# Patient Record
Sex: Female | Born: 1960 | Race: Black or African American | Hispanic: No | Marital: Married | Smoking: Never smoker
Health system: Southern US, Community
[De-identification: ages and names within clinical notes are randomized; demographics above are authoritative.]

## PROBLEM LIST (undated history)

## (undated) DIAGNOSIS — K635 Polyp of colon: Secondary | ICD-10-CM

## (undated) DIAGNOSIS — E119 Type 2 diabetes mellitus without complications: Secondary | ICD-10-CM

## (undated) DIAGNOSIS — D649 Anemia, unspecified: Secondary | ICD-10-CM

## (undated) DIAGNOSIS — E559 Vitamin D deficiency, unspecified: Secondary | ICD-10-CM

## (undated) DIAGNOSIS — I10 Essential (primary) hypertension: Secondary | ICD-10-CM

## (undated) DIAGNOSIS — K649 Unspecified hemorrhoids: Secondary | ICD-10-CM

## (undated) HISTORY — PX: BACK SURGERY: SHX140

## (undated) HISTORY — DX: Vitamin D deficiency, unspecified: E55.9

## (undated) HISTORY — DX: Unspecified hemorrhoids: K64.9

## (undated) HISTORY — DX: Anemia, unspecified: D64.9

## (undated) HISTORY — DX: Type 2 diabetes mellitus without complications: E11.9

## (undated) HISTORY — DX: Polyp of colon: K63.5

## (undated) HISTORY — DX: Essential (primary) hypertension: I10

---

## 1998-10-18 ENCOUNTER — Other Ambulatory Visit: Admission: RE | Admit: 1998-10-18 | Discharge: 1998-10-18 | Payer: Self-pay | Admitting: Obstetrics and Gynecology

## 2000-08-20 ENCOUNTER — Other Ambulatory Visit: Admission: RE | Admit: 2000-08-20 | Discharge: 2000-08-20 | Payer: Self-pay | Admitting: *Deleted

## 2001-06-08 ENCOUNTER — Other Ambulatory Visit: Admission: RE | Admit: 2001-06-08 | Discharge: 2001-06-08 | Payer: Self-pay | Admitting: *Deleted

## 2001-07-07 ENCOUNTER — Ambulatory Visit (HOSPITAL_COMMUNITY): Admission: RE | Admit: 2001-07-07 | Discharge: 2001-07-07 | Payer: Self-pay

## 2001-08-17 ENCOUNTER — Inpatient Hospital Stay (HOSPITAL_COMMUNITY): Admission: RE | Admit: 2001-08-17 | Discharge: 2001-08-19 | Payer: Self-pay

## 2001-08-17 ENCOUNTER — Encounter (INDEPENDENT_AMBULATORY_CARE_PROVIDER_SITE_OTHER): Payer: Self-pay

## 2002-08-04 ENCOUNTER — Encounter: Admission: RE | Admit: 2002-08-04 | Discharge: 2002-08-04 | Payer: Self-pay

## 2003-08-24 ENCOUNTER — Encounter: Admission: RE | Admit: 2003-08-24 | Discharge: 2003-08-24 | Payer: Self-pay | Admitting: *Deleted

## 2004-07-01 HISTORY — PX: EYE SURGERY: SHX253

## 2004-07-01 HISTORY — PX: ABDOMINAL HYSTERECTOMY: SHX81

## 2005-12-05 ENCOUNTER — Encounter: Admission: RE | Admit: 2005-12-05 | Discharge: 2005-12-05 | Payer: Self-pay | Admitting: *Deleted

## 2006-10-09 ENCOUNTER — Encounter: Admission: RE | Admit: 2006-10-09 | Discharge: 2006-11-04 | Payer: Self-pay | Admitting: Family Medicine

## 2007-01-14 ENCOUNTER — Encounter: Admission: RE | Admit: 2007-01-14 | Discharge: 2007-01-14 | Payer: Self-pay | Admitting: *Deleted

## 2008-03-23 ENCOUNTER — Encounter: Admission: RE | Admit: 2008-03-23 | Discharge: 2008-03-23 | Payer: Self-pay | Admitting: Family Medicine

## 2008-05-13 ENCOUNTER — Emergency Department (HOSPITAL_COMMUNITY): Admission: EM | Admit: 2008-05-13 | Discharge: 2008-05-13 | Payer: Self-pay | Admitting: Emergency Medicine

## 2008-08-31 ENCOUNTER — Encounter: Admission: RE | Admit: 2008-08-31 | Discharge: 2008-08-31 | Payer: Self-pay | Admitting: Family Medicine

## 2008-11-23 IMAGING — CT CT ABD/PEL WO
2 of 5 series · 12 of 42 positions shown, 19 images · non-contrast
Comparison: NONE

CLINICAL DATA: Attn. RAIBIKIS, SALDAINIS  Calcification, right side, 
on  plain film. 

CT OF THE ABDOMEN AND PELVIS FOLLOWING ORAL CONTRAST BUT WITHOUT 
INTRAVENOUS  CONTRAST
TECHNIQUE: Multiple axial images were obtained from the lung 
base through the pelvis following oral contrast.

[Series 2: wo · axial · 0.73mm/px · z∈[+694,+1019]mm · 9 of 83 slices shown, 15 images]
[im 9/83  soft-tissue]
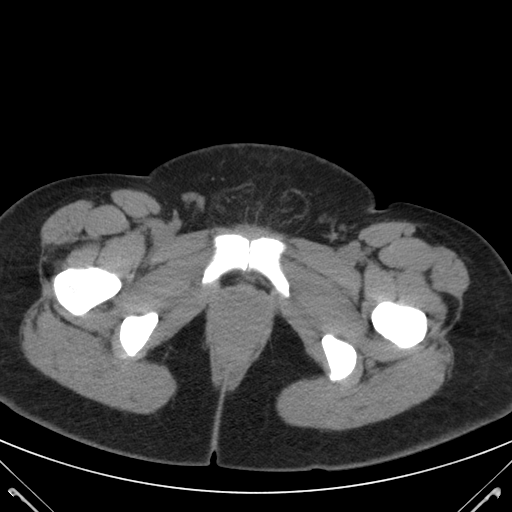
[im 9/83  bone]
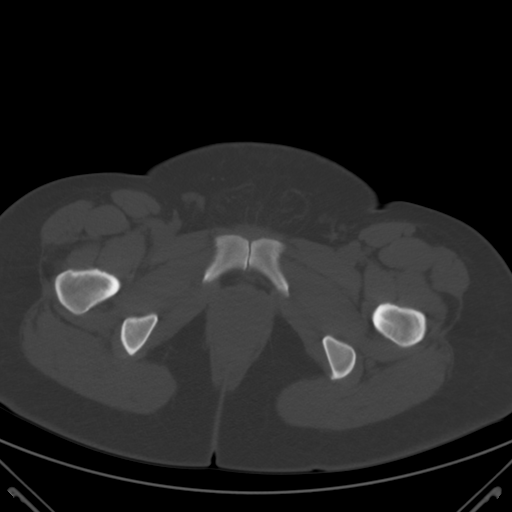
[im 17/83  soft-tissue]
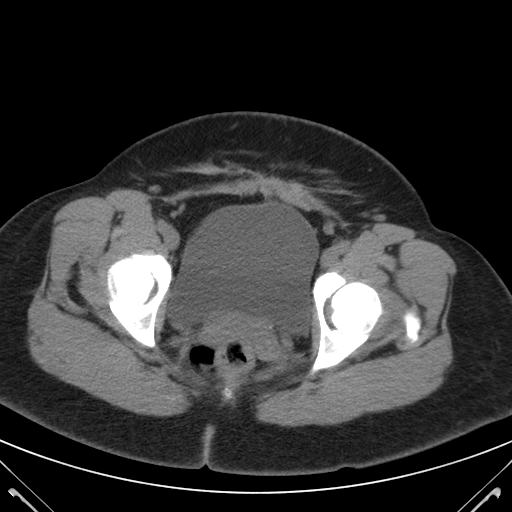
[im 25/83  soft-tissue]
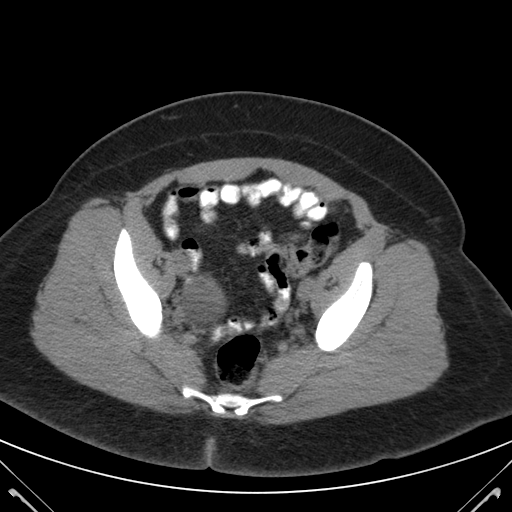
[im 33/83  soft-tissue]
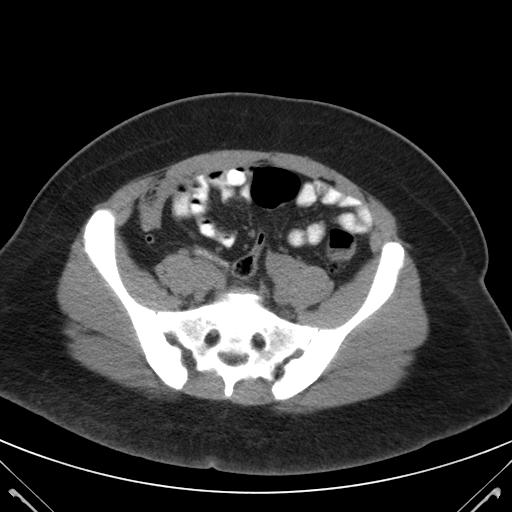
[im 42/83  soft-tissue]
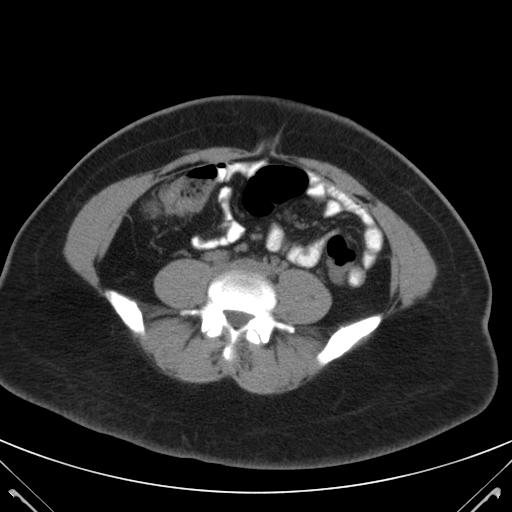
[im 50/83  soft-tissue]
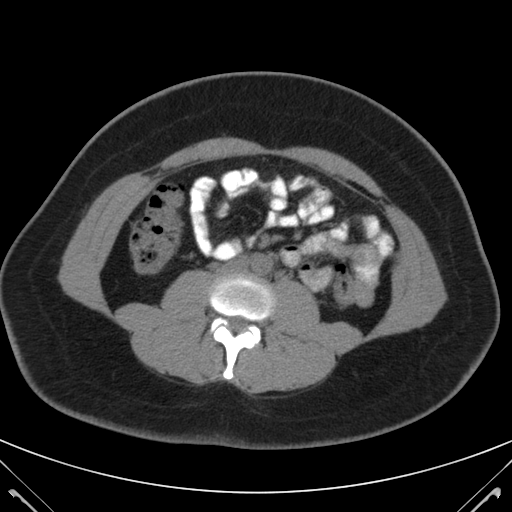
[im 50/83  lung]
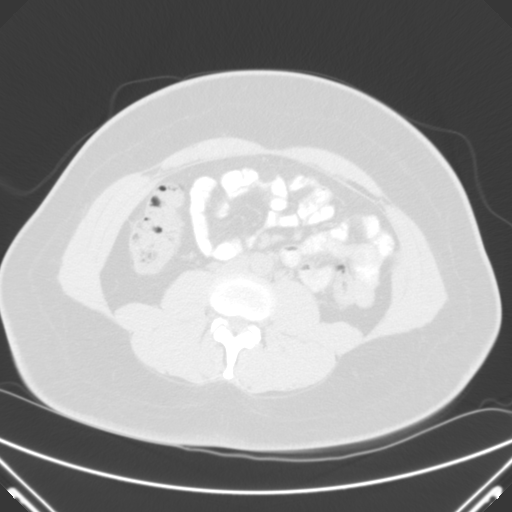
[im 58/83  soft-tissue]
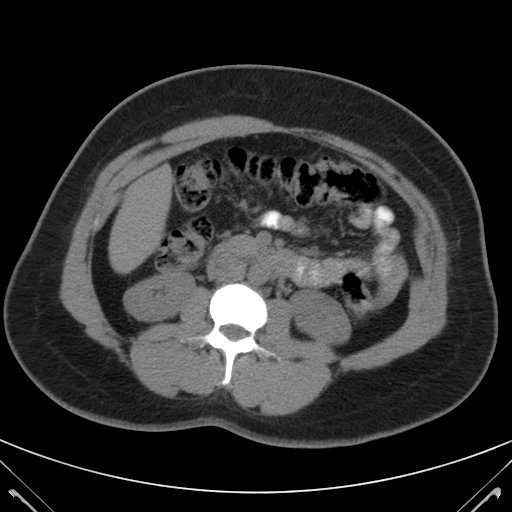
[im 58/83  lung]
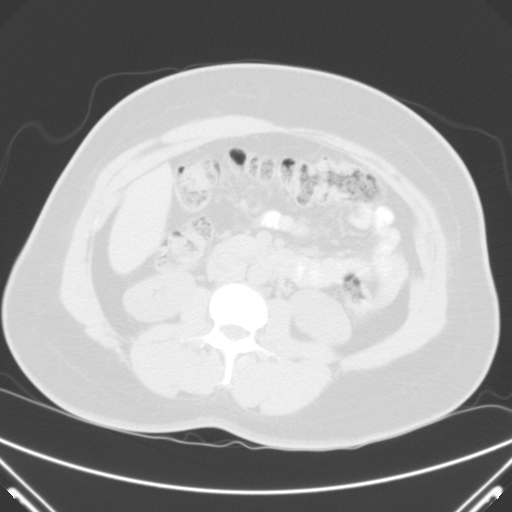
[im 66/83  soft-tissue]
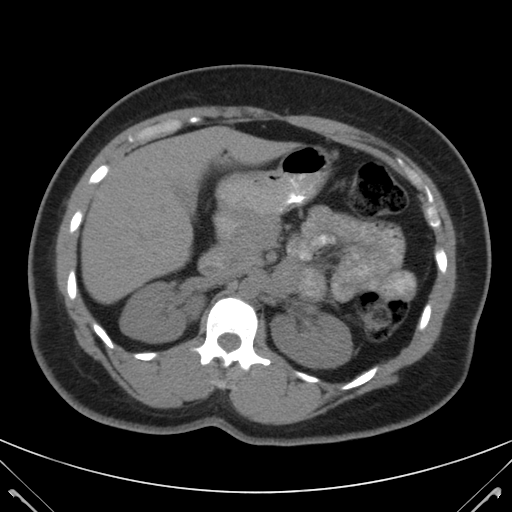
[im 66/83  lung]
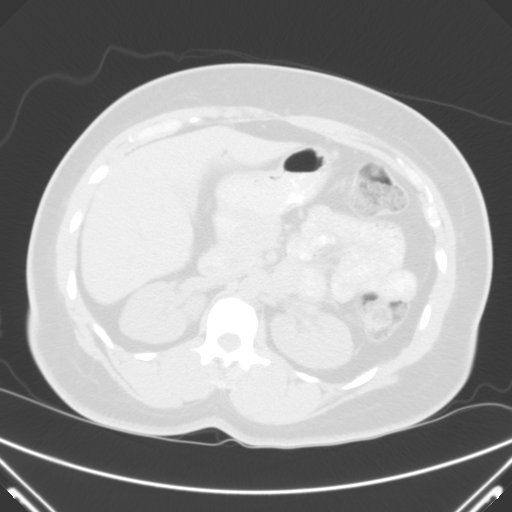
[im 74/83  soft-tissue]
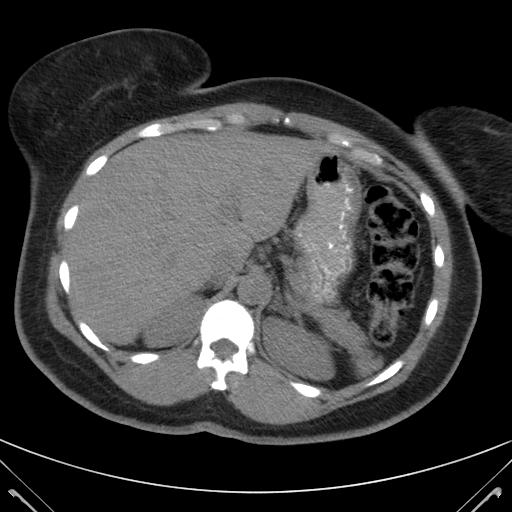
[im 74/83  lung]
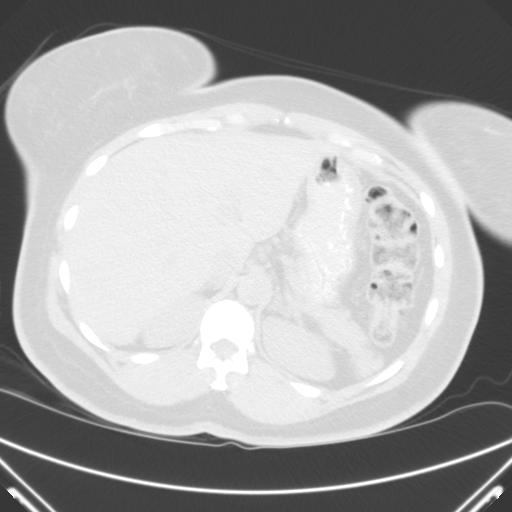
[im 74/83  bone]
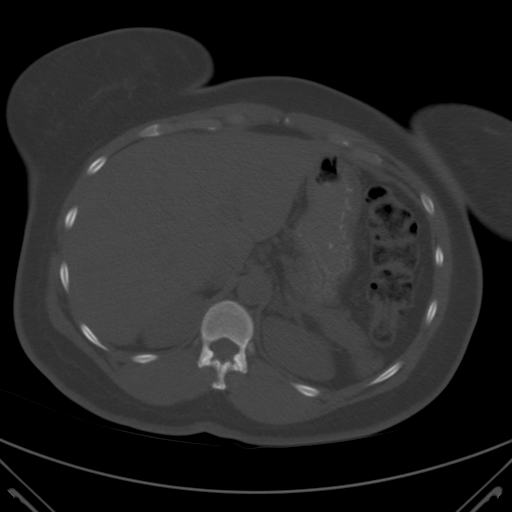

[Series 8034: coronals · coronal · 0.80mm/px · 3 of 67 slices shown, 4 images]
[im 23/67  soft-tissue]
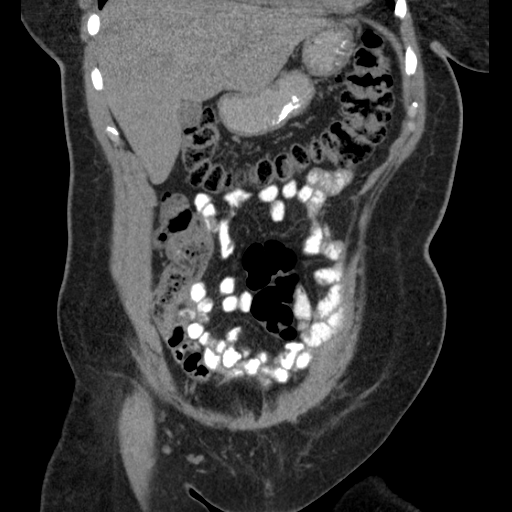
[im 30/67  soft-tissue]
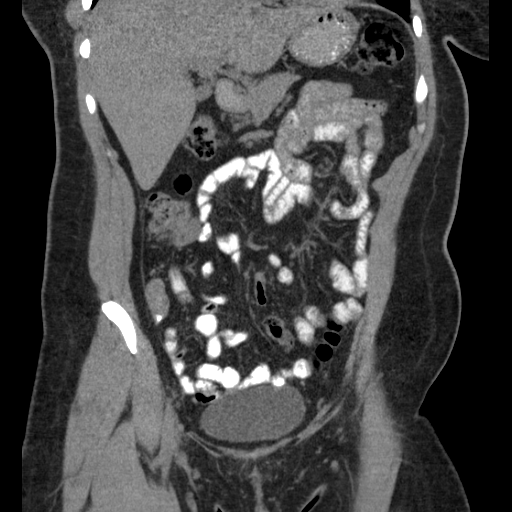
[im 30/67  bone]
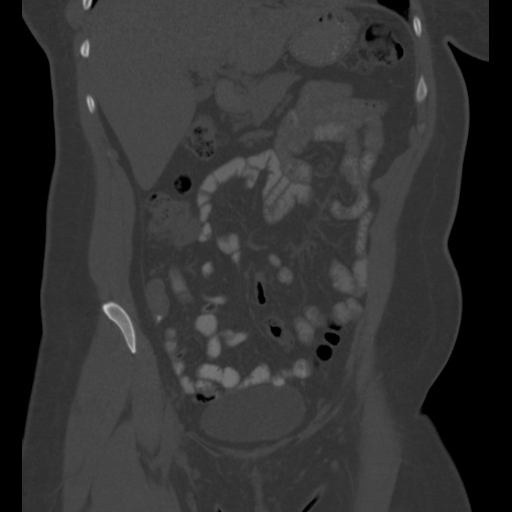
[im 37/67  soft-tissue]
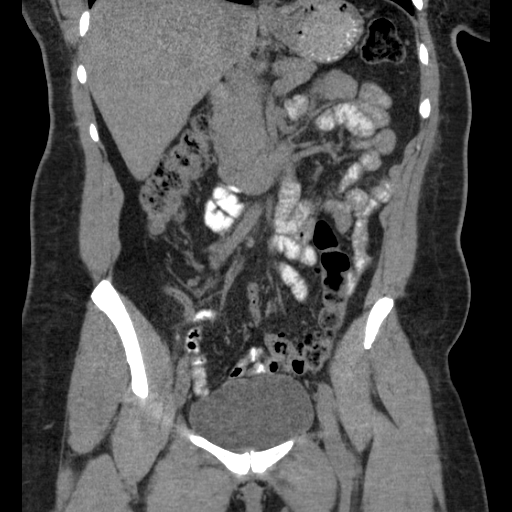

[12 of 42 positions shown; findings below may reference images not displayed]

FINDINGS: No renal or ureteral calculi. No gallstones. The liver, 
pancreas, and spleen are unremarkable. No evidence of aneurysm. In 
the right adnexal region, there is a well-circumscribed 
water-density mass measuring approximately 3.6 x 4 cm. This most 
likely represents an ovarian cyst. No free fluid or cul-de-sac 
masses otherwise. No evidence of appendicitis, diverticulitis, 
hernia, or bowel obstruction. No lung base mass, infiltrate, 
edema, or effusion. No lytic or blastic lesions are identified.
IMPRESSION: Right adnexal cyst. Recommend follow-up pelvic 
ultrasound in 3-4 months to document resolution. While this most 
likely represents a simple ovarian cyst, other etiology such as 
endometrioma, cystadenoma, or cystadenocarcinoma are differential 
diagnostic considerations although considered unlikely at this 
juncture, therefore warranting follow-up. Mathias, Shon 
electronically reviewed on 03/26/2008 Dict Date: 03/26/2008  Tran 
Date:  03/26/2008 DAS  [REDACTED]

## 2009-04-19 ENCOUNTER — Encounter: Admission: RE | Admit: 2009-04-19 | Discharge: 2009-04-19 | Payer: Self-pay | Admitting: Family Medicine

## 2009-05-01 IMAGING — US US PELVIS COMPLETE
1 series · 14 of 25 positions shown · non-contrast
Comparison: Outside CT scan is not available for review.

CLINICAL DATA: Ovarian cyst seen on outside CT scan.

TRANSABDOMINAL AND TRANSVAGINAL ULTRASOUND OF PELVIS
TECHNIQUE: Both transabdominal and transvaginal ultrasound
examinations of the pelvis were performed including evaluation of
the uterus, ovaries, adnexal regions, and pelvic cul-de-sac.

[Series 1: us pelvis complete · 0.37mm/px · 14 of 50 slices shown]
[im 1/50]
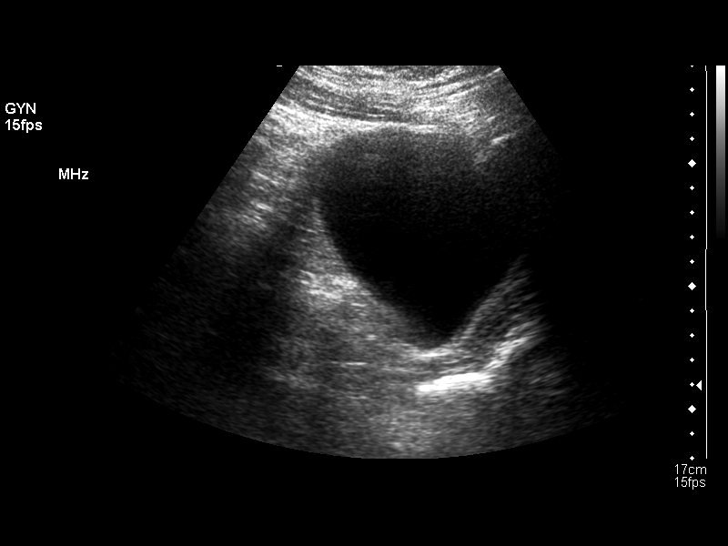
[im 5/50]
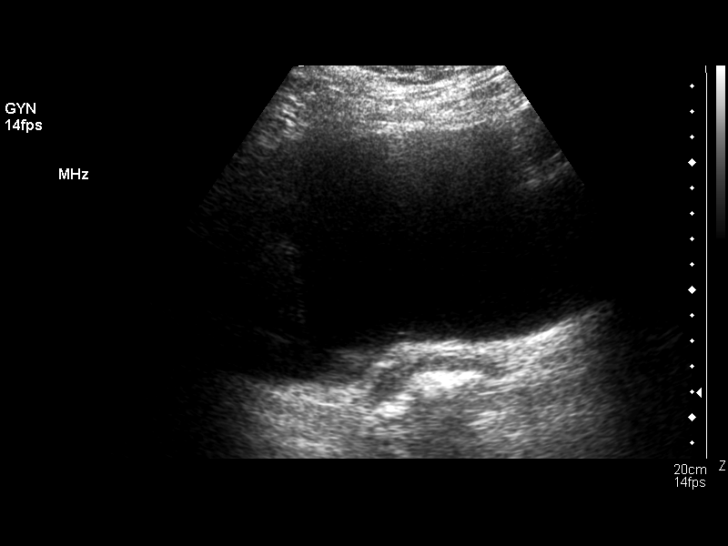
[im 9/50]
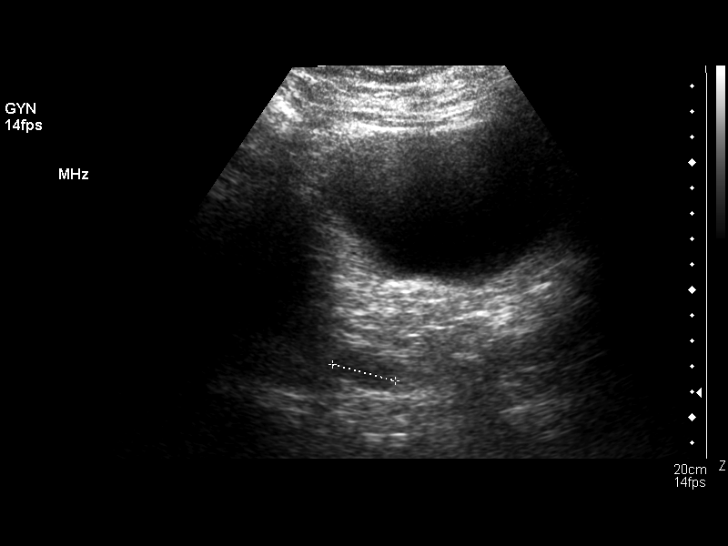
[im 13/50]
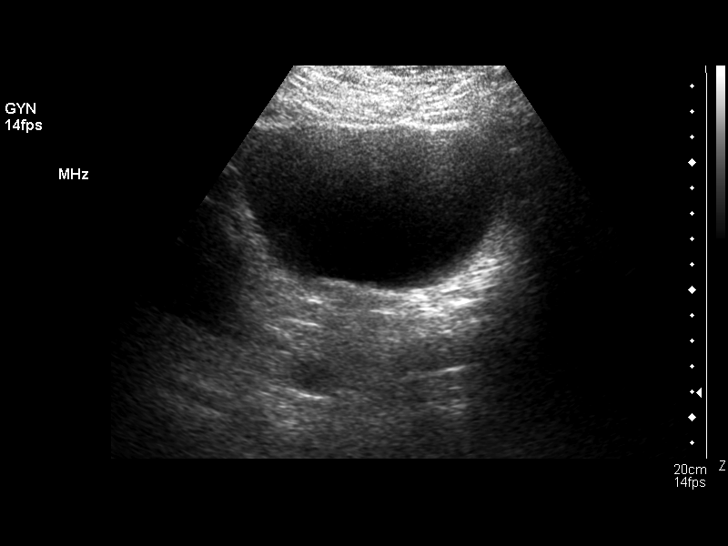
[im 17/50]
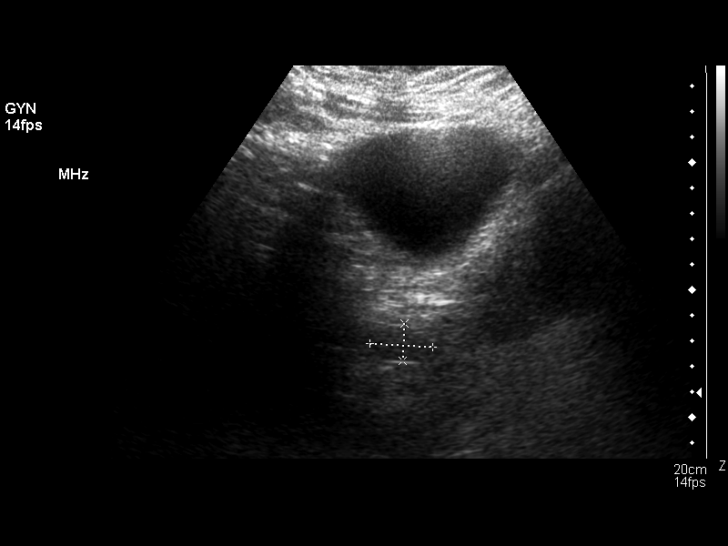
[im 19/50]
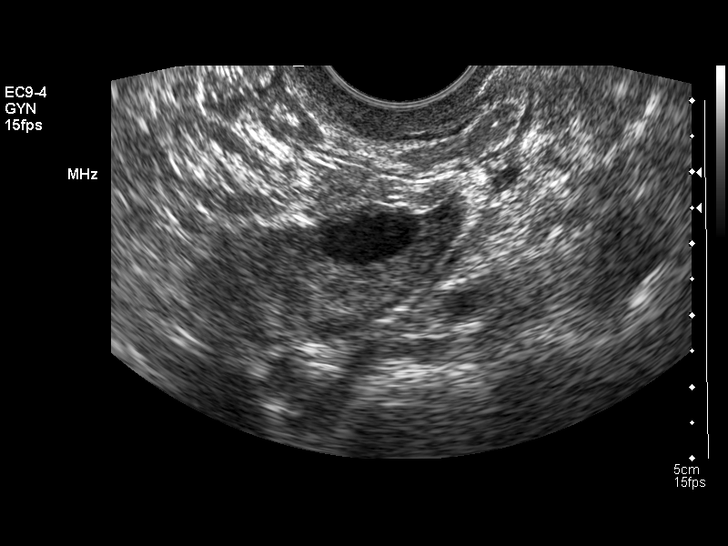
[im 23/50]
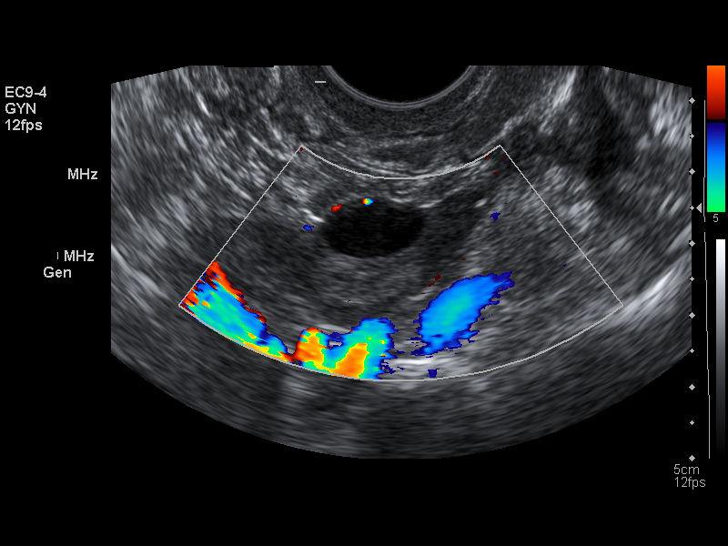
[im 27/50]
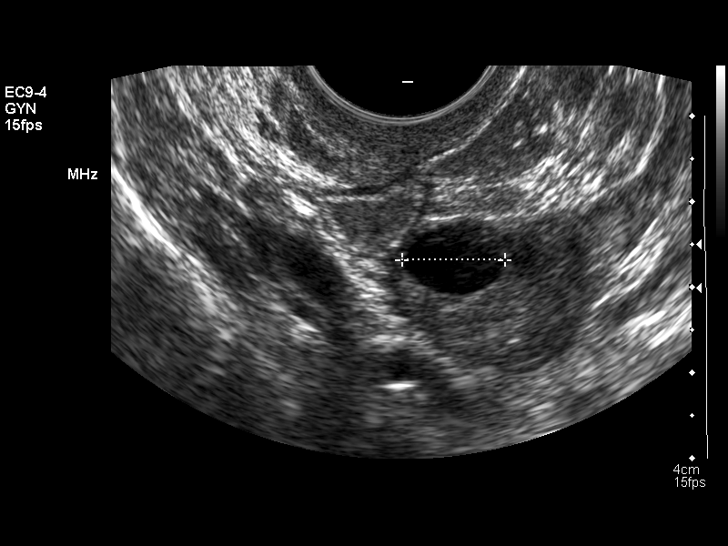
[im 31/50]
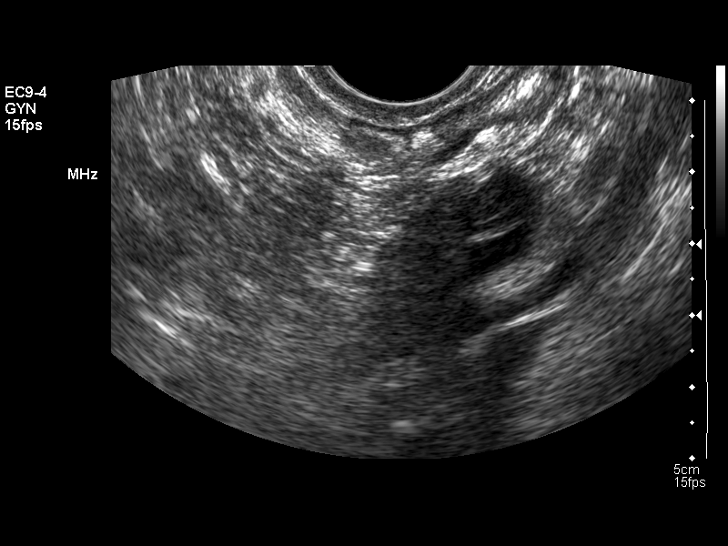
[im 33/50]
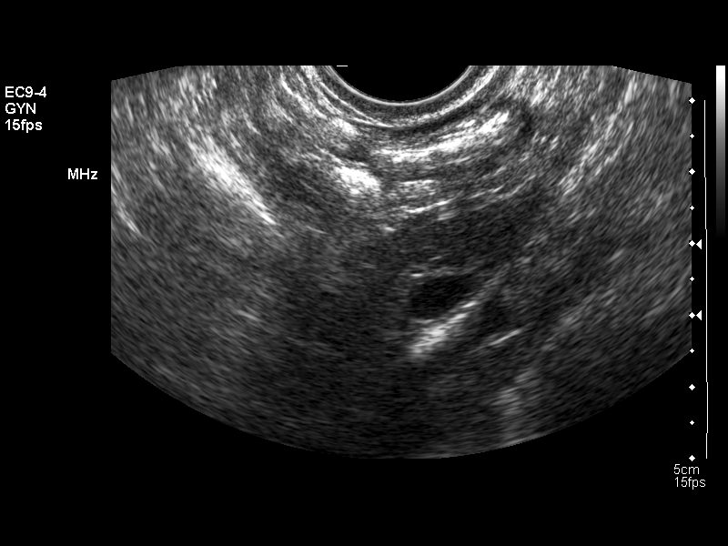
[im 37/50]
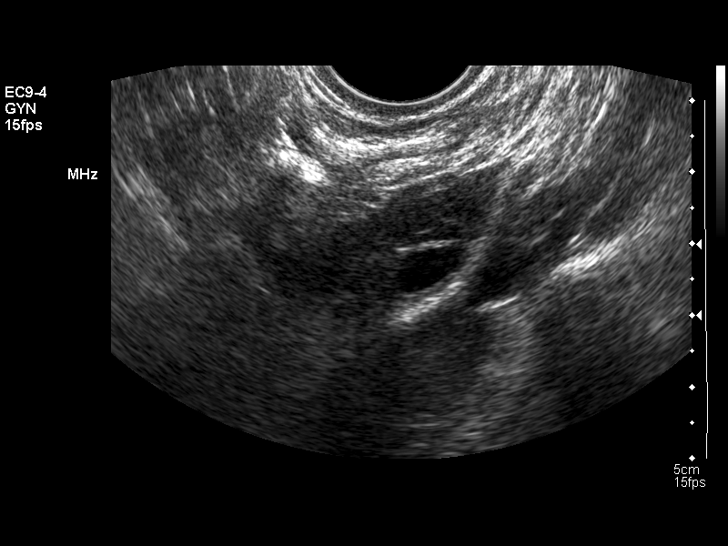
[im 41/50]
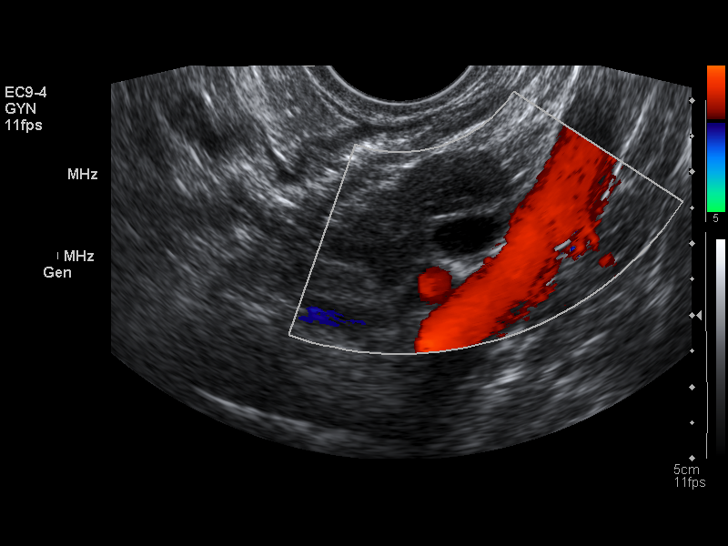
[im 45/50]
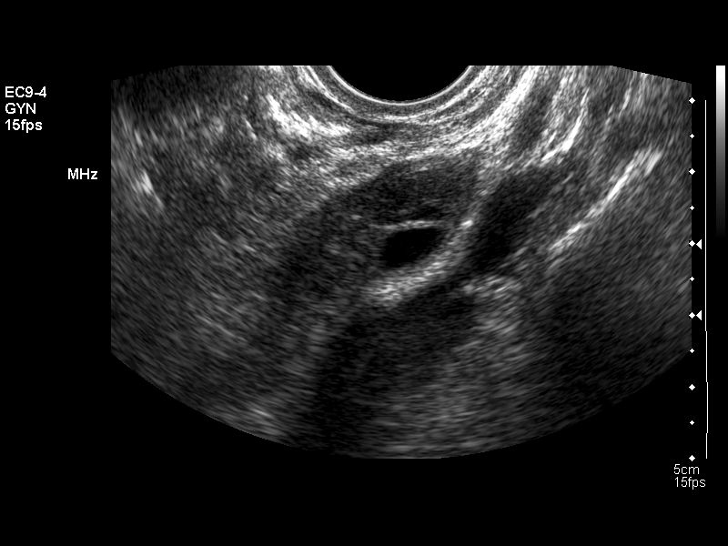
[im 50/50]
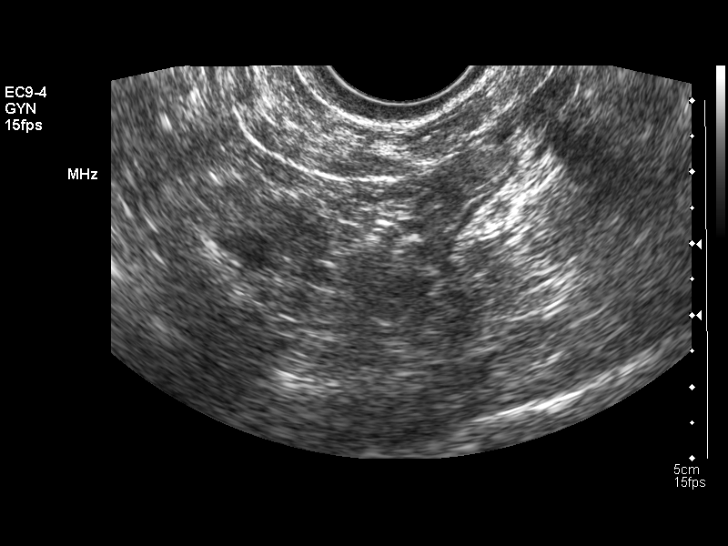

[14 of 25 positions shown; findings below may reference images not displayed]

FINDINGS: The uterus is surgically absent.  The right ovary measures 3.0 x
1.7 x 2.5 cm.  The left ovary measures 3.2 x 1.4 x 2.8 cm.

16 x 9 x 12 mm simple appearing cystic focus in the right ovarian
parenchyma is compatible with a dominant follicle.  A 13 mm complex
hypoechoic nodule in the left ovary is probably a hemorrhagic
follicle.  There is a tiny adjacent 10 mm follicle within the left
ovarian parenchyma.

No evidence for intraperitoneal free fluid.
IMPRESSION: Status post hysterectomy.

No evidence for an adnexal mass.  The patient has dominant
follicles within each ovary, measuring up to 16 mm on the right.
This could account for the cystic process seen in the right ovary
on the outside CT scan.

## 2010-05-02 ENCOUNTER — Encounter: Admission: RE | Admit: 2010-05-02 | Discharge: 2010-05-02 | Payer: Self-pay | Admitting: Family Medicine

## 2010-11-16 NOTE — Op Note (Signed)
[facility]  Patient:    Kelly [name], Kelly [name] Visit Number: [id number] MRN: [id number]          Service Type: GYN Location: [id number] Attending Physician:  Barbaraann Cao Dictated by:   Ronda Fairly. Galen Daft, M.D. Proc. Date: 08/17/01 Admit Date:  08/17/2001   CC:         Raynald Kemp, M.D.  Dr. Janey Greaser, Deboraha Sprang   Operative Report  PREOPERATIVE DIAGNOSES:       1. Symptomatic uterine fibroids.                               2. Anemia.  POSTOPERATIVE DIAGNOSES:      1. Symptomatic uterine fibroids.                               2. Anemia.  OPERATION:                    Total abdominal hysterectomy.  SURGEON:                      Ronda Fairly. Galen Daft, M.D.  ASSISTANT:                    Raynald Kemp, M.D.  COMPLICATIONS:                None.  ANESTHESIA:                   General anesthesia.  ESTIMATED BLOOD LOSS:         600 cc.  SPECIMENS:                    Uterus and cervix.  DESCRIPTION OF PROCEDURE:     The patient was identified as Kelly [name].  We discussed the surgical procedure immediately prior to the surgery as well as in the office. The patient understood that if the ovaries were normal, that she wished to have them in place and this was discussed. The risks and benefits of possible future ovarian problems versus the risks of removal of ovaries and hormone therapy. The patient again had decided to keep the ovaries in place.  After intravenous antibiotics given preoperatively, Betadine prep and sterile technique, bladder was catheterized with indwelling Foley. Pfannenstiel incision was utilized for abdominal access.  This was done without difficulty.  The uterus was delivered through the Pfannenstiel incision as this was the most facilitated way to approach the uterus with multiple large uterine fibroids 20 to 22-week size.  The round ligaments were clamped, divided and ligated with 0 Vicryl.  The anterior bladder flap was created. The  utero-ovarian ligaments were clamped, divided and ligated with 0 Vicryl as well on either side. This separated the uterus from the ovarian specimens. The ovaries were left in place and were hemostatic.  Subsequent skeletonization of the anterior and posterior areas was carried out.  There was securing of the blood vessel supply on the right side without difficulty; on the left side after securing the blood vessel supply, one blood vessel did begin pumping which was clamped readily without difficulty and ligated with 0 Vicryl.  This lead to complete hemostasis.  There were successive clamps down the cardinal ligaments which included the blood vessels from the uterines and this was done with care avoiding the other  structures in the pelvis.  The ureters were separate and distinct from the surgery at all times.  The uterosacral ligaments were clamped, ligated and divided and the subsequent tissues allowed for ____________ vaginal cuff and this was done from the left side moving medially to the right side.  The specimen was removed, uterus and cervix; it was intact and there was an open cuff which was then closed with 0 Vicryl in a running fashion.  It was supported to the cardinal ligaments on either side and uterosacral ligament complex.  There was complete hemostasis noted at these sites.  The right round ligament had a little oozing.  This was treated effectively with round ligament being ligated again with a Vicryl tie. Again, inspection for hemostasis was complete.  All sponge, needle and instrument counts were accounted for at this point and irrigation was then performed.  The total estimated blood loss was 600 cc.  After irrigation was performed, there was again checking for hemostasis. All intra-abdominal structures were hemostatic.  Again, second count was correct and the abdomen was closed; first with closing the muscles with running layer at the midline, all subfascial tissues were  hemostatic and the fascia was then closed with #1 Vicryl.  This was done without difficulty.  The subcuticular tissues were irrigated and found to be hemostatic.  Skin was closed with 4-0 Monocryl and then the subcutaneous tissues were injected with 12 cc total of 0.25% Marcaine with 1:200,000 epinephrine for postoperative analgesia.  This was tolerated well and care was taken to avoid intravascular injection.  The patient tolerated the procedure well and left the operating room in stable condition. All sponge, needle and instrument counts were correct at the end of the case as they were throughout the case. Dictated by:   Ronda Fairly. Galen Daft, M.D. Attending Physician:  Barbaraann Cao DD:  08/17/01 TD:  08/17/01 Job: [id number]

## 2011-04-02 LAB — POCT I-STAT, CHEM 8
Calcium, Ion: 1.14
HCT: 42
Hemoglobin: 14.3
Sodium: 138
TCO2: 30

## 2011-05-01 ENCOUNTER — Other Ambulatory Visit: Payer: Self-pay | Admitting: Family Medicine

## 2011-05-01 DIAGNOSIS — Z1231 Encounter for screening mammogram for malignant neoplasm of breast: Secondary | ICD-10-CM

## 2011-05-22 ENCOUNTER — Ambulatory Visit: Payer: Self-pay

## 2011-06-06 ENCOUNTER — Ambulatory Visit
Admission: RE | Admit: 2011-06-06 | Discharge: 2011-06-06 | Disposition: A | Discharge status: Home / Self Care | Payer: BC Managed Care – PPO | Source: Ambulatory Visit | Attending: Family Medicine | Admitting: Family Medicine

## 2011-06-06 DIAGNOSIS — Z1231 Encounter for screening mammogram for malignant neoplasm of breast: Secondary | ICD-10-CM

## 2012-05-13 ENCOUNTER — Other Ambulatory Visit: Payer: Self-pay | Admitting: Family Medicine

## 2012-05-13 DIAGNOSIS — Z1231 Encounter for screening mammogram for malignant neoplasm of breast: Secondary | ICD-10-CM

## 2012-06-25 ENCOUNTER — Ambulatory Visit
Admission: RE | Admit: 2012-06-25 | Discharge: 2012-06-25 | Disposition: A | Discharge status: Home / Self Care | Payer: BC Managed Care – PPO | Source: Ambulatory Visit | Attending: Family Medicine | Admitting: Family Medicine

## 2012-06-25 DIAGNOSIS — Z1231 Encounter for screening mammogram for malignant neoplasm of breast: Secondary | ICD-10-CM

## 2012-12-03 ENCOUNTER — Encounter (INDEPENDENT_AMBULATORY_CARE_PROVIDER_SITE_OTHER): Payer: Self-pay

## 2012-12-07 ENCOUNTER — Ambulatory Visit (INDEPENDENT_AMBULATORY_CARE_PROVIDER_SITE_OTHER): Payer: Managed Care, Other (non HMO) | Admitting: Surgery

## 2012-12-07 ENCOUNTER — Encounter (INDEPENDENT_AMBULATORY_CARE_PROVIDER_SITE_OTHER): Payer: Self-pay | Admitting: Surgery

## 2012-12-07 VITALS — BP 120/74 | HR 74 | Temp 97.6°F | Resp 17 | Ht 64.5 in | Wt 208.0 lb

## 2012-12-07 DIAGNOSIS — L723 Sebaceous cyst: Secondary | ICD-10-CM

## 2012-12-07 NOTE — Progress Notes (Signed)
Patient ID: Kelly [name], female   DOB: 09-27-1960, 52 y.o.   MRN: [id number]  Chief Complaint  Patient presents with  . New Evaluation    eval recurrent seb cyst - upper back    HPI Kelly [name] is a 52 y.o. female.  Patient sent at the request of Dr. Zachery Dauer in consultation for sebaceous cyst upper back. He has been present for many years. History of infection or drainage noted. It causes discomfort especially when pressure is put on it. HPI  Past Medical History  Diagnosis Date  . Hypertension   . Colon polyps   . Hemorrhoids   . Vitamin D deficiency   . Anemia     Past Surgical History  Procedure Laterality Date  . Eye surgery  2006    LASIC  . Abdominal hysterectomy  2006    Family History  Problem Relation Age of Onset  . Cancer Mother   . Hypertension Mother   . Diabetes Mother   . Hyperlipidemia Father   . Diabetes Father   . Vision loss Father   . Hyperlipidemia Brother   . Hypertension Brother     Social History History  Substance Use Topics  . Smoking status: Never Smoker   . Smokeless tobacco: Never Used  . Alcohol Use: No    Allergies  Allergen Reactions  . Amlodipine Swelling    Current Outpatient Prescriptions  Medication Sig Dispense Refill  . aspirin 325 MG EC tablet Take 325 mg by mouth daily.      Marland Kitchen lisinopril-hydrochlorothiazide (PRINZIDE,ZESTORETIC) 20-12.5 MG per tablet Take 1 tablet by mouth daily.      . Multiple Vitamin (MULTIVITAMIN) tablet Take 1 tablet by mouth daily.       No current facility-administered medications for this visit.    Review of Systems Review of Systems  Constitutional: Negative for fever, chills and unexpected weight change.  HENT: Negative for hearing loss, congestion, sore throat, trouble swallowing and voice change.   Eyes: Negative for visual disturbance.  Respiratory: Negative for cough and wheezing.   Cardiovascular: Negative for chest pain, palpitations and leg swelling.  Gastrointestinal: Negative  for nausea, vomiting, abdominal pain, diarrhea, constipation, blood in stool, abdominal distention and anal bleeding.  Genitourinary: Negative for hematuria, vaginal bleeding and difficulty urinating.  Musculoskeletal: Negative for arthralgias.  Skin: Negative for rash and wound.  Neurological: Negative for seizures, syncope and headaches.  Hematological: Negative for adenopathy. Does not bruise/bleed easily.  Psychiatric/Behavioral: Negative for confusion.    Blood pressure 120/74, pulse 74, temperature 97.6 F (36.4 C), temperature source Temporal, resp. rate 17, height 5' 4.5" (1.638 m), weight 208 lb (94.348 kg), SpO2 97.00%.  Physical Exam Physical Exam  Constitutional: She is oriented to person, place, and time. She appears well-developed and well-nourished.  HENT:  Head: Normocephalic and atraumatic.  Eyes: Pupils are equal, round, and reactive to light.  Neck: Normal range of motion.  Cardiovascular: Normal rate and regular rhythm.   Musculoskeletal: Normal range of motion.  Neurological: She is alert and oriented to person, place, and time.  Skin:     Psychiatric: She has a normal mood and affect. Her behavior is normal. Judgment and thought content normal.    Data Reviewed none  Assessment    3 cm sebaceous cyst upper back with history of drainage and pain    Plan    Recommend excision of 3 cm subcutaneous sebaceous cyst upper back.The procedure has been discussed with the patient.  Alternative therapies have  been discussed with the patient.  Operative risks include bleeding,  Infection,  Organ injury,  Nerve injury,  Blood vessel injury,  DVT,  Pulmonary embolism,  Death,  And possible reoperation.  Medical management risks include worsening of present situation.  The success of the procedure is 50 -90 % at treating patients symptoms.  The patient understands and agrees to proceed.       Kelly Duck A. 12/07/2012, 10:15 AM

## 2012-12-07 NOTE — Patient Instructions (Signed)

## 2013-01-25 ENCOUNTER — Encounter (INDEPENDENT_AMBULATORY_CARE_PROVIDER_SITE_OTHER): Payer: Managed Care, Other (non HMO) | Admitting: Surgery

## 2013-05-25 ENCOUNTER — Other Ambulatory Visit: Payer: Self-pay

## 2013-05-25 DIAGNOSIS — Z1231 Encounter for screening mammogram for malignant neoplasm of breast: Secondary | ICD-10-CM

## 2013-06-30 ENCOUNTER — Ambulatory Visit
Admission: RE | Admit: 2013-06-30 | Discharge: 2013-06-30 | Disposition: A | Discharge status: Home / Self Care | Payer: Self-pay | Source: Ambulatory Visit

## 2013-06-30 DIAGNOSIS — Z1231 Encounter for screening mammogram for malignant neoplasm of breast: Secondary | ICD-10-CM

## 2014-07-12 ENCOUNTER — Other Ambulatory Visit: Payer: Self-pay

## 2014-07-12 DIAGNOSIS — Z1231 Encounter for screening mammogram for malignant neoplasm of breast: Secondary | ICD-10-CM

## 2014-07-27 ENCOUNTER — Ambulatory Visit
Admission: RE | Admit: 2014-07-27 | Discharge: 2014-07-27 | Disposition: A | Discharge status: Home / Self Care | Payer: 59 | Source: Ambulatory Visit

## 2014-07-27 DIAGNOSIS — Z1231 Encounter for screening mammogram for malignant neoplasm of breast: Secondary | ICD-10-CM

## 2015-06-23 ENCOUNTER — Other Ambulatory Visit: Payer: Self-pay | Admitting: Family Medicine

## 2015-06-23 DIAGNOSIS — R9389 Abnormal findings on diagnostic imaging of other specified body structures: Secondary | ICD-10-CM

## 2015-07-12 ENCOUNTER — Inpatient Hospital Stay
Admission: RE | Admit: 2015-07-12 | Discharge: 2015-07-12 | Disposition: A | Discharge status: Home / Self Care | Payer: Self-pay | Source: Ambulatory Visit | Attending: Family Medicine | Admitting: Family Medicine

## 2015-07-12 ENCOUNTER — Other Ambulatory Visit: Payer: Self-pay | Admitting: Family Medicine

## 2015-07-12 DIAGNOSIS — R9389 Abnormal findings on diagnostic imaging of other specified body structures: Secondary | ICD-10-CM

## 2015-07-13 ENCOUNTER — Other Ambulatory Visit: Payer: Self-pay | Admitting: Family Medicine

## 2015-07-13 DIAGNOSIS — E041 Nontoxic single thyroid nodule: Secondary | ICD-10-CM

## 2015-07-27 ENCOUNTER — Ambulatory Visit
Admission: RE | Admit: 2015-07-27 | Discharge: 2015-07-27 | Disposition: A | Discharge status: Home / Self Care | Payer: 59 | Source: Ambulatory Visit | Attending: Family Medicine | Admitting: Family Medicine

## 2015-07-27 ENCOUNTER — Encounter (INDEPENDENT_AMBULATORY_CARE_PROVIDER_SITE_OTHER): Payer: Self-pay

## 2015-07-27 ENCOUNTER — Other Ambulatory Visit (HOSPITAL_COMMUNITY)
Admission: RE | Admit: 2015-07-27 | Discharge: 2015-07-27 | Disposition: A | Discharge status: Home / Self Care | Payer: 59 | Source: Ambulatory Visit | Attending: Radiology | Admitting: Radiology

## 2015-07-27 DIAGNOSIS — E041 Nontoxic single thyroid nodule: Secondary | ICD-10-CM | POA: Insufficient documentation

## 2015-08-01 ENCOUNTER — Ambulatory Visit: Payer: Managed Care, Other (non HMO)

## 2015-08-08 ENCOUNTER — Ambulatory Visit: Payer: Managed Care, Other (non HMO)

## 2015-08-09 ENCOUNTER — Other Ambulatory Visit: Payer: Self-pay

## 2015-08-09 DIAGNOSIS — Z1231 Encounter for screening mammogram for malignant neoplasm of breast: Secondary | ICD-10-CM

## 2015-08-15 ENCOUNTER — Ambulatory Visit: Payer: Managed Care, Other (non HMO)

## 2015-08-28 ENCOUNTER — Ambulatory Visit
Admission: RE | Admit: 2015-08-28 | Discharge: 2015-08-28 | Disposition: A | Discharge status: Home / Self Care | Payer: 59 | Source: Ambulatory Visit

## 2015-08-28 DIAGNOSIS — Z1231 Encounter for screening mammogram for malignant neoplasm of breast: Secondary | ICD-10-CM

## 2015-09-05 ENCOUNTER — Ambulatory Visit: Payer: 59 | Admitting: Endocrinology

## 2015-09-25 ENCOUNTER — Ambulatory Visit: Payer: 59

## 2016-03-26 IMAGING — US US THYROID BIOPSY
1 series · 13 of 16 positions shown · non-contrast
Comparison: none

INDICATION: Patient with history of left thyroid nodule and prior ultrasound at
outside facility which revealed a dominant left mid to lower pole
cystic nodule with mural nodule measuring 4.7 cm in greatest
dimension. Request now received for needle aspirate biopsy of this
dominant left thyroid cystic nodule

[Series 1: us thyroid biopsy · 0.08mm/px · 16 acquisitions, 13 frames shown]
[im 1/16]
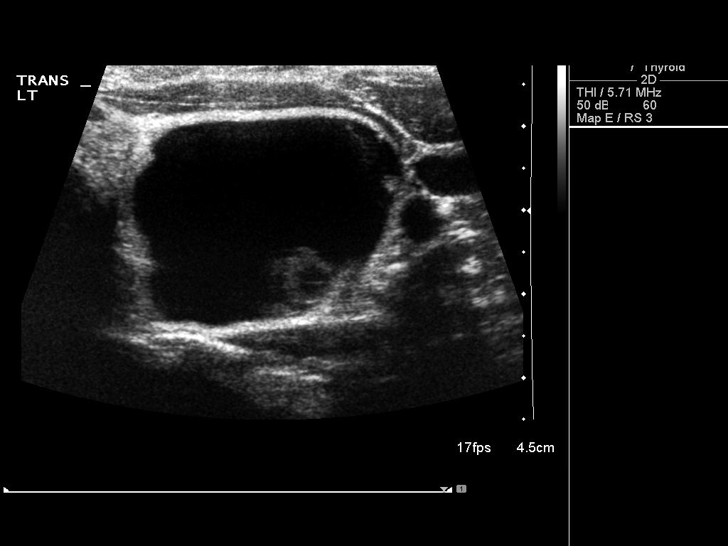
[im 2/16]
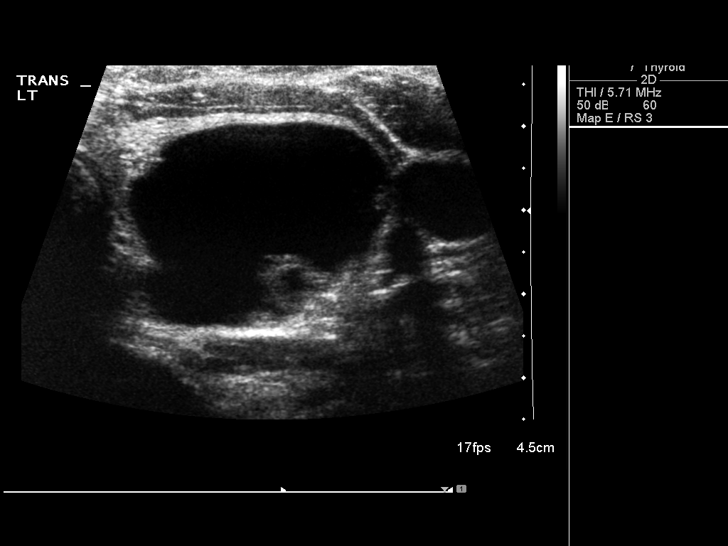
[im 4/16]
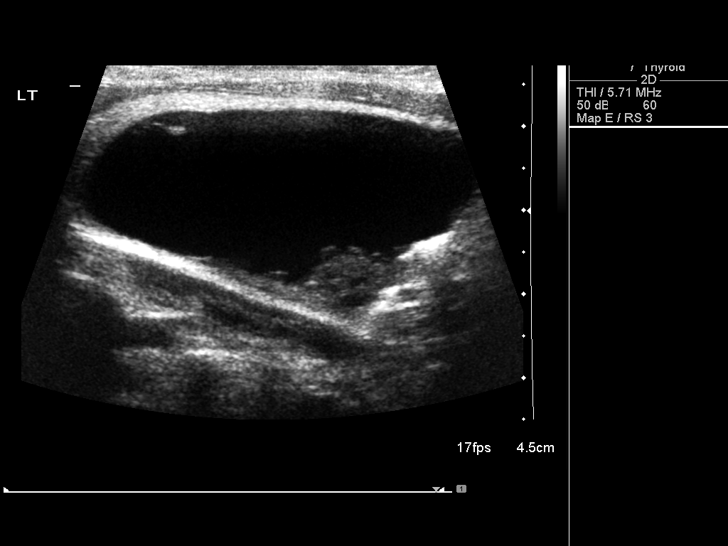
[im 5/16]
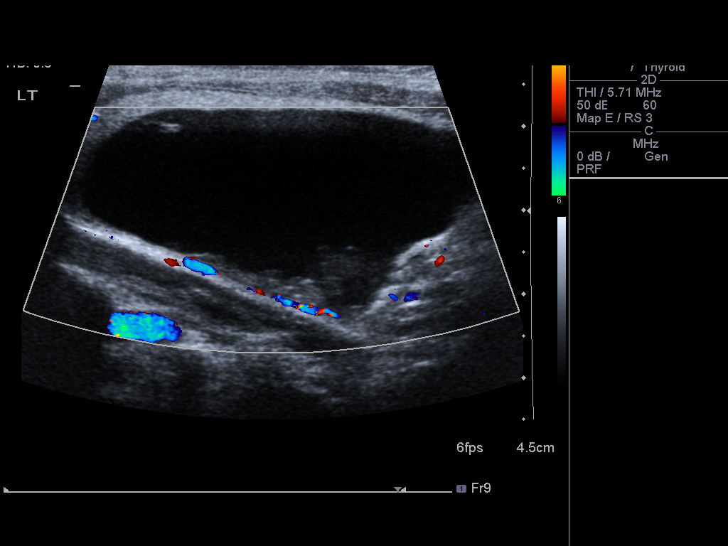
[im 6/16]
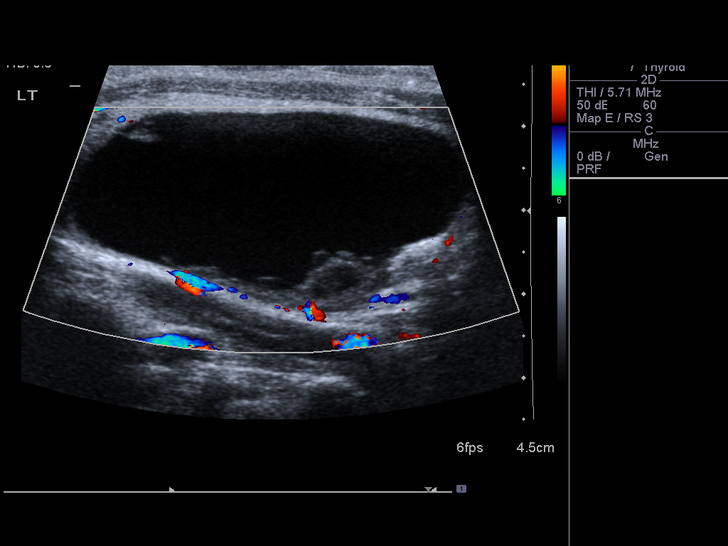
[im 7/16]
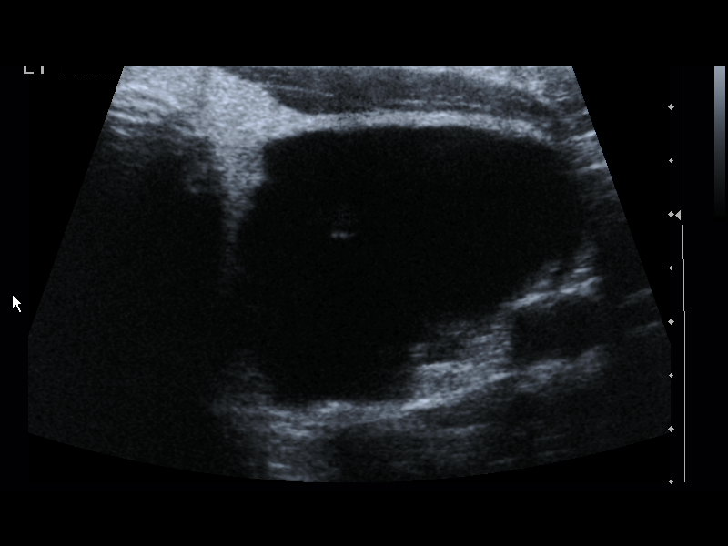
[im 9/16]
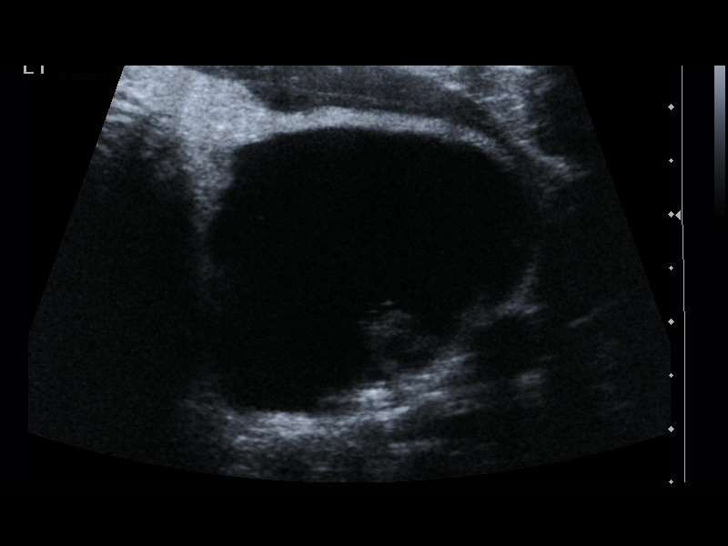
[im 10/16]
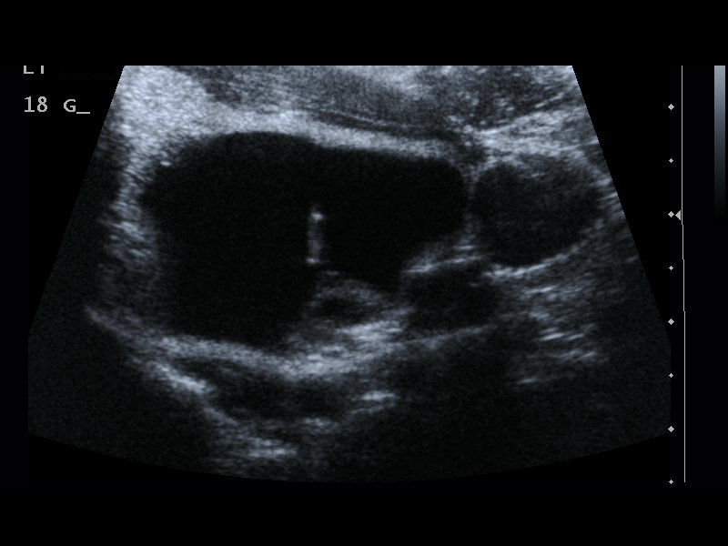
[im 11/16]
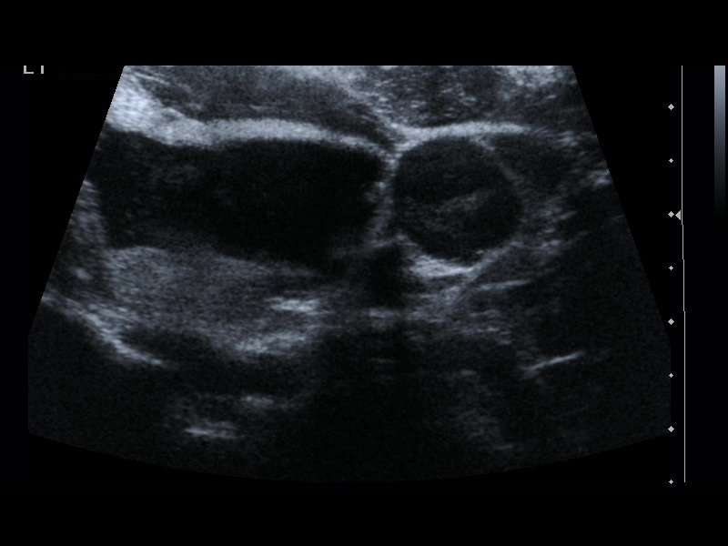
[im 12/16]
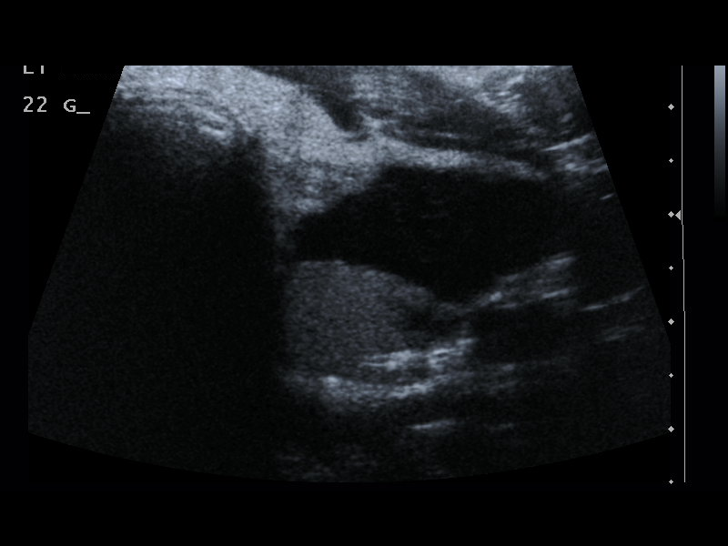
[im 13/16]
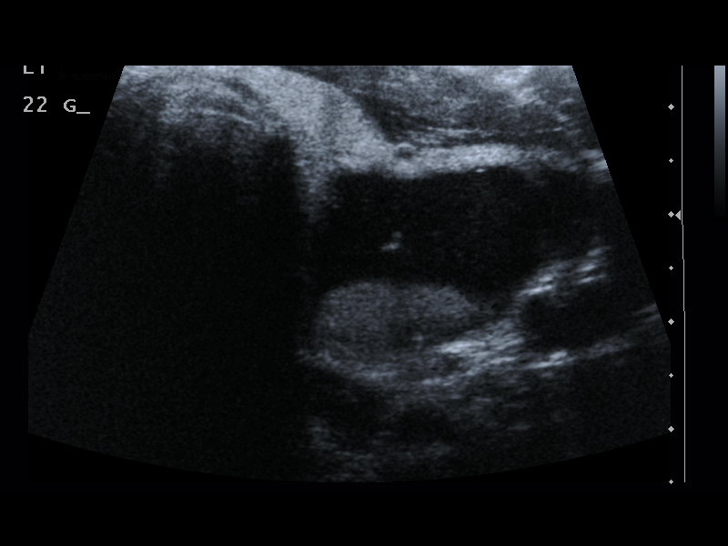
[im 15/16]
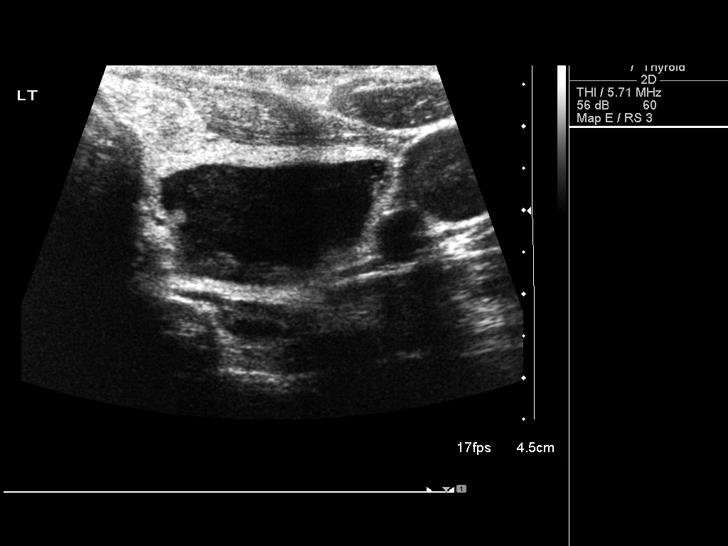
[im 16/16]
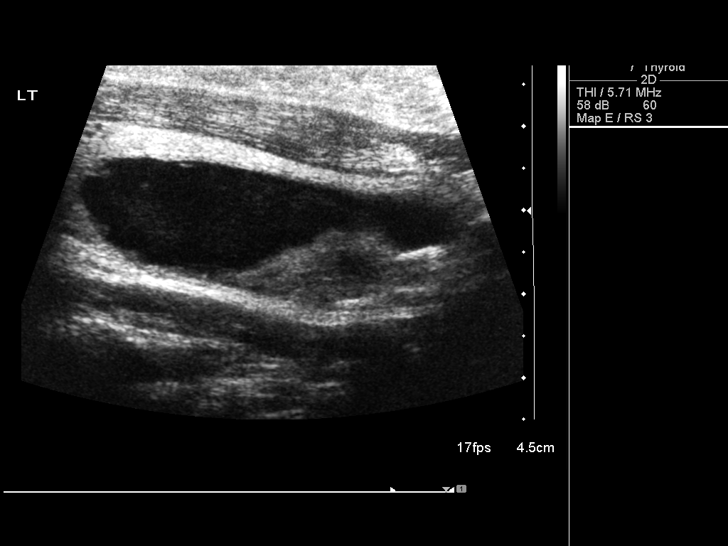

[13 of 16 positions shown; findings below may reference images not displayed]

EXAM:
ULTRASOUND GUIDED NEEDLE ASPIRATE BIOPSY OF DOMINANT LEFT THYROID
CYSTIC NODULE

MEDICATIONS:
None.

ANESTHESIA/SEDATION:
None

FLUOROSCOPY TIME:  None

COMPLICATIONS:
None immediate.

PROCEDURE:
Informed written consent was obtained from the patient after a
thorough discussion of the procedural risks, benefits and
alternatives. All questions were addressed. Maximal Sterile Barrier
Technique was utilized including caps, mask, sterile gowns, sterile
gloves, sterile drape, hand hygiene and skin antiseptic. A timeout
was performed prior to the initiation of the procedure.

Ultrasound was performed to localize and mark an adequate site for
the biopsy. The patient was then prepped and draped in a normal
sterile fashion. Local anesthesia was provided with 1% lidocaine.
Using direct ultrasound guidance, 5 passes were made using 18/22 and
25 gauge needles into the dominant cystic nodule within the left mid
to lower lobe of the thyroid. Ultrasound was used to confirm needle
placements on all occasions. Approximately 15 cc of dark brown fluid
were removed from the cystic portion. Specimens were sent to
Pathology for analysis.
IMPRESSION: Ultrasound guided needle aspirate biopsy performed of a dominant
left mid to lower pole cystic thyroid nodule. Final pathology
pending.

## 2016-06-12 ENCOUNTER — Encounter: Payer: Self-pay | Admitting: Neurology

## 2016-06-12 ENCOUNTER — Ambulatory Visit (INDEPENDENT_AMBULATORY_CARE_PROVIDER_SITE_OTHER): Payer: BLUE CROSS/BLUE SHIELD | Admitting: Neurology

## 2016-06-12 VITALS — BP 129/83 | HR 80 | Resp 16 | Ht 64.5 in | Wt 189.0 lb

## 2016-06-12 DIAGNOSIS — R519 Headache, unspecified: Secondary | ICD-10-CM | POA: Insufficient documentation

## 2016-06-12 DIAGNOSIS — R0683 Snoring: Secondary | ICD-10-CM

## 2016-06-12 DIAGNOSIS — R51 Headache: Secondary | ICD-10-CM | POA: Diagnosis not present

## 2016-06-12 DIAGNOSIS — R0681 Apnea, not elsewhere classified: Secondary | ICD-10-CM | POA: Diagnosis not present

## 2016-06-12 NOTE — Progress Notes (Signed)
[facility]    Provider:  Dr Lucia Gaskins Referring Provider: Redmond Baseman, MD Primary Care Physician:  Redmond Baseman, MD  CC:  Intractable headache  HPI:  Kelly [name] is a 55 y.o. female here as a referral from Dr. Zachery Dauer for intractable headache. Past medical history high blood pressure, diabetes. Never smoked, no alcohol. Blood pressures have been elevated and medications have been adjusted recently. Diabetes has been not controlled in the past hemoglobin A1c 8.6 in December 2016 but recently in June 2017 was 5.9. Here with her husband who also provides information. She started having headaches 6 weeks ago in the setting of elevated blood pressure. But blood pressure now controlled and headaches continue. The headache varies, sometimes on the left, sometimes on the top, sometimes across the forehead, just pressure, the headache wakes her up at night and is in the morning, snores heavily, she is very tired during the day she is fatigued. She could doze off during the day. She is overweight. Headaches are daily, this morning it was a light headache and now it is on the right and on the left. Her husband says her snoring is loud, apneic events. She has nausea associated with the headache. No light or sound sensitivity. The headache can be severe. Hydrocodone helps but doesn't really make it better. No other focal neurologic deficits or complaints, no known inciting events or head trauma, no other associated symptoms or modifying factors.  Reviewed notes, labs and imaging from outside physicians, which showed: Reviewed primary care notes. Patient has elevated blood pressures and mild headaches. Patient has been hydrocodone no neurologic deficits, she was seen [facility], no history of migraines but suffers from frequent headaches. CT of the brain was negative. Diagnosed with intractable headaches unspecified chronicity pattern suspected migraine conversion and  difficulty in treating due to medical problems such as diabetes mellitus, hypertension. Patient has been complaining of headaches to primary care, was seen at the emergency room in November for non-intractable headache.  Reviewed emergency room notes 07/13/2015. She presented with headaches and complaints of elevated blood pressure. She was diagnosed with hypertension, non-intractable headaches, she was given clonidine for her blood pressure and asked to follow-up with her primary care.  CMP June 2017 BUN 18, creatinine 0.68. Otherwise CMP unremarkable. CT of the head was unremarkable.  CT BRAIN HEAD WO CONTRAST. Comparison none. Radiation dose reduction was utilized (automated exposure control, mA or kV adjustment based on patient size, or iterative image reconstruction).  FINDINGS: Visualized portions of the paranasal sinuses are clear. There is no depressed skull fracture. There is no intracranial hemorrhage or hydrocephalus. There is no mass, mass effect, or midline shift. No CT evidence of acute infarct.   Narrative:  Diagnosis Class Normal Acquisition Device MAC Ventricular Rate 69 Atrial Rate 69 P-R Interval 200 QRS Duration 88 Q-T Interval 379 QTC Calculation(Bezet) 406 Calculated P Axis 43 Calculated R Axis 9 Calculated T Axis 25  Diagnosis Normal sinus rhythm Normal ECG When compared with ECG of 02-Jun-2015 03:37,     Review of Systems: Patient complains of symptoms per HPI as well as the following symptoms: no CP, no SOB, snoring, insomnia. Pertinent negatives per HPI. All others negative.   Social History   Social History  . Marital status: Married    Spouse name: N/A  . Number of children: 1  . Years of education: BA   Occupational History  . [employer]     Social History Main Topics  . Smoking  status: Never Smoker  . Smokeless tobacco: Never Used  . Alcohol use Yes     Comment: Soc  . Drug use: No  . Sexual activity: Not on file   Other Topics Concern    . Not on file   Social History Narrative   Rare caffeine use     Family History  Problem Relation Age of Onset  . Cancer Mother   . Hypertension Mother   . Diabetes Mother   . Hyperlipidemia Father   . Diabetes Father   . Vision loss Father   . Hyperlipidemia Brother   . Hypertension Brother     Past Medical History:  Diagnosis Date  . Anemia   . Colon polyps   . Diabetes mellitus without complication (HCC)   . Hemorrhoids   . Hypertension   . Vitamin D deficiency     Past Surgical History:  Procedure Laterality Date  . ABDOMINAL HYSTERECTOMY  2006  . BACK SURGERY    . EYE SURGERY  2006   LASIC    Current Outpatient Prescriptions  Medication Sig Dispense Refill  . amLODipine (NORVASC) 2.5 MG tablet Take 2.5 mg by mouth daily.  2  . aspirin (ASPIR-81) 81 MG EC tablet 1 tablet    . Cholecalciferol (VITAMIN D) 2000 units CAPS Take by mouth.    Marland Kitchen. lisinopril-hydrochlorothiazide (PRINZIDE,ZESTORETIC) 20-12.5 MG per tablet Take 1 tablet by mouth daily.    . metFORMIN (GLUCOPHAGE) 500 MG tablet Take by mouth daily with breakfast.    . Multiple Vitamin (MULTIVITAMIN) tablet Take 1 tablet by mouth daily.     No current facility-administered medications for this visit.     Allergies as of 06/12/2016 - Review Complete 06/12/2016  Allergen Reaction Noted  . Amlodipine Swelling 12/03/2012  . Meloxicam Other (See Comments) 01/25/2015  . Metoprolol Other (See Comments) 06/12/2016  . Tramadol hcl Other (See Comments) 07/20/2015    Vitals: BP 129/83   Pulse 80   Resp 16   Ht 5' 4.5" (1.638 m)   Wt 189 lb (85.7 kg)   BMI 31.94 kg/m  Last Weight:  Wt Readings from Last 1 Encounters:  06/12/16 189 lb (85.7 kg)   Last Height:   Ht Readings from Last 1 Encounters:  06/12/16 5' 4.5" (1.638 m)    Physical exam: Exam: Gen: NAD, conversant, well nourised, obese, well groomed                     CV: RRR, no MRG. No Carotid Bruits. No peripheral edema, warm,  nontender Eyes: Conjunctivae clear without exudates or hemorrhage  Neuro: Detailed Neurologic Exam  Speech:    Speech is normal; fluent and spontaneous with normal comprehension.  Cognition:    The patient is oriented to person, place, and time;     recent and remote memory intact;     language fluent;     normal attention, concentration,     fund of knowledge Cranial Nerves:    The pupils are equal, round, and reactive to light. The fundi are normal and spontaneous venous pulsations are present. Visual fields are full to finger confrontation. Extraocular movements are intact. Trigeminal sensation is intact and the muscles of mastication are normal. The face is symmetric. The palate elevates in the midline. Hearing intact. Voice is normal. Shoulder shrug is normal. The tongue has normal motion without fasciculations.   Coordination:    Normal finger to nose and heel to shin. Normal rapid alternating movements.  Gait:    Not ataxic  Motor Observation:    No asymmetry, no atrophy, and no involuntary movements noted. Tone:    Normal muscle tone.    Posture:    Posture is normal. normal erect    Strength:    Strength is V/V in the upper and lower limbs.      Sensation: intact to LT     Reflex Exam:  DTR's:    Absent AJs otherwise deep tendon reflexes in the upper and lower extremities are normal bilaterally.   Toes:    The toes are downgoing bilaterally.   Clonus:    Clonus is absent.      Assessment/Plan:  [age] year old with 6 weeks of headaches, snoring, daytime fatigue, morning headaches, witnessed apneic events. Headaches could be due to OSA, do not sound migrainous or tension type. Would like a sleep test. In the meantime sh eis encouraged to sleep on her side.  Redmond BasemanWONG,FRANCIS PATRICK, MD  Naomie DeanAntonia Ahern, MD  [facility] [address] [place], [place] [address]  Phone [phone] Fax [phone]

## 2016-06-12 NOTE — Patient Instructions (Signed)
Remember to drink plenty of fluid, eat healthy meals and do not skip any meals. Try to eat protein with a every meal and eat a healthy snack such as fruit or nuts in between meals. Try to keep a regular sleep-wake schedule and try to exercise daily, particularly in the form of walking, 20-30 minutes a day, if you can.   As far as diagnostic testing: Sleep eval  I would like to see you back for sleep eval, sooner if we need to. Please call us with any interim questions, concerns, problems, updates or refill requests.   Our phone number is [phone]. We also have an after hours call service for urgent matters and there is a physician on-call for urgent questions. For any emergencies you know to call 911 or go to the nearest emergency room

## 2016-06-17 ENCOUNTER — Encounter: Payer: Self-pay | Admitting: Neurology

## 2016-06-17 ENCOUNTER — Ambulatory Visit (INDEPENDENT_AMBULATORY_CARE_PROVIDER_SITE_OTHER): Payer: BLUE CROSS/BLUE SHIELD | Admitting: Neurology

## 2016-06-17 VITALS — BP 154/92 | HR 71 | Resp 20 | Ht 64.5 in | Wt 189.0 lb

## 2016-06-17 DIAGNOSIS — G4761 Periodic limb movement disorder: Secondary | ICD-10-CM

## 2016-06-17 DIAGNOSIS — R51 Headache: Secondary | ICD-10-CM | POA: Diagnosis not present

## 2016-06-17 DIAGNOSIS — G2581 Restless legs syndrome: Secondary | ICD-10-CM

## 2016-06-17 DIAGNOSIS — E669 Obesity, unspecified: Secondary | ICD-10-CM | POA: Diagnosis not present

## 2016-06-17 DIAGNOSIS — R519 Headache, unspecified: Secondary | ICD-10-CM

## 2016-06-17 DIAGNOSIS — R400 Somnolence: Secondary | ICD-10-CM | POA: Diagnosis not present

## 2016-06-17 DIAGNOSIS — R0681 Apnea, not elsewhere classified: Secondary | ICD-10-CM | POA: Diagnosis not present

## 2016-06-17 DIAGNOSIS — R0683 Snoring: Secondary | ICD-10-CM | POA: Diagnosis not present

## 2016-06-17 NOTE — Progress Notes (Signed)
Subjective:    Kelly [name] ID: Kelly Kelly [name] is a 55 y.o. female.  HPI     Kelly Foley, MD, PhD [facility] [address] [address] [place], [place] [address]  Dear Kelly Kelly [name],   I saw your Kelly [name], Kelly Kelly [name], upon your kind request, in my clinic today for initial consultation of her sleep disorder, in particular, concern for underlying obstructive sleep apnea. Kelly Kelly [name] is unaccompanied today. As you know, Kelly Kelly [name] is a 55 year old right-handed woman with an underlying medical history of type 2 diabetes, hypertension, vitamin D deficiency, recurrent headaches and obesity, who reports snoring, morning headaches and excessive daytime somnolence. I reviewed your office note from 06/12/2016. Her Epworth sleepiness score is 17 out of 24 today, her fatigue score is 45 out of 63. She works full-time as a [employer] officer, but currently on leave. She lives at home with her spouse and son. She is a nonsmoker. She used to drink alcohol occasionally, but none since 1996, she does not drink caffeine on a daily basis. She has RLS type symptoms for months, and nearly nightly, husband has noted restless sleep, leg movements and witnessed breathing pauses. It helps to move her legs in bed, kicks her covers off.  Does not typically have to watch TV at night, but has TV on in Bedroom. No pets in Kelly household. No FHx of OSA.  She has a bedtime between 9:30 and 10 PM, wakeup time generally is between 445 and 5 AM. Some 6 weeks ago she went to [facility] ER because of elevated blood pressure. She was started by her previous PCP on a new blood pressure medication which did not help. She recently switched to another PCP. She does not endorse any night to night nocturia. She has had nighttime palpitations as well. She has had morning headaches for Kelly past 2 months or so.  Her Past Medical History Is Significant For: Past Medical History:  Diagnosis Date  . Anemia   . Colon polyps    . Diabetes mellitus without complication (HCC)   . Hemorrhoids   . Hypertension   . Vitamin D deficiency     Her Past Surgical History Is Significant For: Past Surgical History:  Procedure Laterality Date  . ABDOMINAL HYSTERECTOMY  2006  . BACK SURGERY    . EYE SURGERY  2006   LASIC    Her Family History Is Significant For: Family History  Problem Relation Age of Onset  . Cancer Mother   . Hypertension Mother   . Diabetes Mother   . Hyperlipidemia Father   . Diabetes Father   . Vision loss Father   . Hyperlipidemia Brother   . Hypertension Brother     Her Social History Is Significant For: Social History   Social History  . Marital status: Married    Spouse name: N/A  . Number of children: 1  . Years of education: BA   Occupational History  . [employer]     Social History Main Topics  . Smoking status: Never Smoker  . Smokeless tobacco: Never Used  . Alcohol use Yes     Comment: Soc  . Drug use: No  . Sexual activity: Not Asked   Other Topics Concern  . None   Social History Narrative   Rare caffeine use     Her Allergies Are:  Allergies  Allergen Reactions  . Amlodipine Swelling  . Meloxicam Other (See Comments)  . Metoprolol Other (See Comments)  Hypertension   . Tramadol Hcl Other (See Comments)  :   Her Current Medications Are:  Outpatient Encounter Prescriptions as of 06/17/2016  Medication Sig  . amLODipine (NORVASC) 2.5 MG tablet Take 2.5 mg by mouth 2 (two) times daily.   Marland Kitchen aspirin (ASPIR-81) 81 MG EC tablet 1 tablet  . Cholecalciferol (VITAMIN D) 2000 units CAPS Take by mouth.  Marland Kitchen lisinopril-hydrochlorothiazide (PRINZIDE,ZESTORETIC) 20-12.5 MG per tablet Take 1 tablet by mouth daily.  . metFORMIN (GLUCOPHAGE) 500 MG tablet Take 250 mg by mouth 2 (two) times daily with a meal.  . Multiple Vitamin (MULTIVITAMIN) tablet Take 1 tablet by mouth daily.  . [DISCONTINUED] metFORMIN (GLUCOPHAGE) 500 MG tablet Take by mouth daily with  breakfast.   No facility-administered encounter medications on file as of 06/17/2016.   :  Review of Systems:  Out of a complete 14 point review of systems, all are reviewed and negative with Kelly exception of these symptoms as listed below: Review of Systems  Neurological: Positive for headaches.       Pt presents today to discuss her sleep. Pt awakens with a headache almost every day. Pt has never had a sleep study but does endorse snoring at night.  Epworth Sleepiness Scale 0= would never doze 1= slight chance of dozing 2= moderate chance of dozing 3= high chance of dozing  Sitting and reading: 3 Watching TV: 3 Sitting inactive in a public place (ex. Theater or meeting): 2 As a passenger in a car for an hour without a break: 3 Lying down to rest in Kelly afternoon:  2 Sitting and talking to someone: 1 Sitting quietly after lunch (no alcohol): 1 In a car, while stopped in traffic: 2 Total: 17   Psychiatric/Behavioral: Positive for sleep disturbance.    Objective:  Neurologic Exam  Physical Exam Physical Examination:   Vitals:   06/17/16 1530  BP: (!) 154/92  Pulse: 71  Resp: 20   General Examination: Kelly Kelly [name] is a very pleasant 55 y.o. female in no acute distress. She appears well-developed and well-nourished and well groomed.   HEENT: Normocephalic, atraumatic, pupils are equal, round and reactive to light and accommodation. Funduscopic exam is normal with sharp disc margins noted. Extraocular tracking is good without limitation to gaze excursion or nystagmus noted. Normal smooth pursuit is noted. Hearing is grossly intact. Tympanic membranes are clear bilaterally. Face is symmetric with normal facial animation and normal facial sensation. Speech is clear with no dysarthria noted. There is no hypophonia. There is no lip, neck/head, jaw or voice tremor. Neck is supple with full range of passive and active motion. There are no carotid bruits on auscultation. Oropharynx  exam reveals: mild mouth dryness, good dental hygiene and mild airway crowding, due to Smaller airway entry and redundant soft palate, larger appearing uvula. Mallampati is class II. Tongue protrudes centrally and palate elevates symmetrically. Tonsils are 1+ in size. Neck size is 15.5 inches. She has a Mild overbite. Nasal inspection reveals no significant nasal mucosal bogginess or redness and no septal deviation.   Chest: Clear to auscultation without wheezing, rhonchi or crackles noted.  Heart: S1+S2+0, regular and normal without murmurs, rubs or gallops noted.   Abdomen: Soft, non-tender and non-distended with normal bowel sounds appreciated on auscultation.  Extremities: There is trace pitting edema in Kelly distal lower extremities bilaterally. Pedal pulses are intact.  Skin: Warm and dry without trophic changes noted.  Musculoskeletal: exam reveals no obvious joint deformities, tenderness or joint swelling or erythema.  Neurologically:  Mental status: Kelly Kelly [name] is awake, alert and oriented in all 4 spheres. Her immediate and remote memory, attention, language skills and fund of knowledge are appropriate. There is no evidence of aphasia, agnosia, apraxia or anomia. Speech is clear with normal prosody and enunciation. Thought process is linear. Mood is normal and affect is normal.  Cranial nerves II - XII are as described above under HEENT exam. In addition: shoulder shrug is normal with equal shoulder height noted. Motor exam: Normal bulk, strength and tone is noted. There is no drift, tremor or rebound. Romberg is negative. Reflexes are 2+ throughout. Babinski: Toes are flexor bilaterally. Fine motor skills and coordination: intact with normal finger taps, normal hand movements, normal rapid alternating patting, normal foot taps and normal foot agility.  Cerebellar testing: No dysmetria or intention tremor on finger to nose testing. Heel to shin is unremarkable bilaterally. There is no  truncal or gait ataxia.  Sensory exam: intact to light touch in Kelly upper and lower extremities.  Gait, station and balance: She stands easily. No veering to one side is noted. No leaning to one side is noted. Posture is age-appropriate and stance is narrow based. Gait shows normal stride length and normal pace. No problems turning are noted. Tandem walk is unremarkable.        Assessment and Plan:   In summary, Kelly Kelly [name] is a very pleasant 55 y.o.-year old female with an underlying medical history of type 2 diabetes, hypertension, vitamin D deficiency, recurrent headaches and obesity, whose history and physical exam are indeed concerning for obstructive sleep apnea (OSA).She has experienced morning headaches, has significant daytime somnolence, has witnessed apneic pauses while asleep. In addition, she is endorsing restless leg symptoms and leg movements at night. We will be on Kelly lookout for PLMs during her sleep study. A lab attended sleep study is justified in her case. I had a long chat with Kelly Kelly [name] about my findings and Kelly diagnosis of OSA, its prognosis and treatment options. We talked about medical treatments, surgical interventions and non-pharmacological approaches. I explained in particular Kelly risks and ramifications of untreated moderate to severe OSA, especially with respect to developing cardiovascular disease down [address], including congestive heart failure, difficult to treat hypertension, cardiac arrhythmias, or stroke. Even type 2 diabetes has, in part, been linked to untreated OSA. Symptoms of untreated OSA include daytime sleepiness, memory problems, mood irritability and mood disorder such as depression and anxiety, lack of energy, as well as recurrent headaches, especially morning headaches. We talked about trying to maintain a healthy lifestyle in general, as well as Kelly importance of weight control. I encouraged Kelly Kelly [name] to eat healthy, exercise daily and keep well  hydrated, to keep a scheduled bedtime and wake time routine, to not skip any meals and eat healthy snacks in between meals. I advised Kelly Kelly [name] not to drive when feeling sleepy. I recommended Kelly following at this time: sleep study with potential positive airway pressure titration. (We will score hypopneas at 3% and split Kelly sleep study into diagnostic and treatment portion, if Kelly estimated. 2 hour AHI is >15/h).   I explained Kelly sleep test procedure to Kelly Kelly [name] and also outlined possible surgical and non-surgical treatment options of OSA, including Kelly use of a custom-made dental device (which would require a referral to a specialist dentist or oral surgeon), upper airway surgical options, such as pillar implants, radiofrequency surgery, tongue base surgery, and UPPP (which would involve a referral to an  ENT surgeon). Rarely, jaw surgery such as mandibular advancement may be considered.  I also explained Kelly CPAP treatment option to Kelly Kelly [name], who indicated that she would be willing to try CPAP if Kelly need arises. I explained Kelly importance of being compliant with PAP treatment, not only for insurance purposes but primarily to improve Her symptoms, and for Kelly Kelly [name]'s long term health benefit, including to reduce Her cardiovascular risks. I answered all her questions today and Kelly Kelly [name] was in agreement. I would like to see her back after Kelly sleep study is completed and encouraged her to call with any interim questions, concerns, problems or updates.   Thank you very much for allowing me to participate in Kelly care of this nice Kelly [name]. If I can be of any further assistance to you please do not hesitate to talk to me.   Sincerely,   Kelly FoleySaima Allure Greaser, MD, PhD

## 2016-06-17 NOTE — Patient Instructions (Addendum)
Based on your symptoms and your exam I believe you are at risk for obstructive sleep apnea or OSA, and I think we should proceed with a sleep study to determine whether you do or do not have OSA and how severe it is. If you have more than mild OSA, I want you to consider treatment with CPAP. Please remember, the risks and ramifications of moderate to severe obstructive sleep apnea or OSA are: Cardiovascular disease, including congestive heart failure, stroke, difficult to control hypertension, arrhythmias, and even type 2 diabetes has been linked to untreated OSA. Sleep apnea causes disruption of sleep and sleep deprivation in most cases, which, in turn, can cause recurrent headaches, problems with memory, mood, concentration, focus, and vigilance. Most people with untreated sleep apnea report excessive daytime sleepiness, which can affect their ability to drive. Please do not drive if you feel sleepy.   I will likely see you back after your sleep study to go over the test results and where to go from there. We will call you after your sleep study to advise about the results (most likely, you will hear from Lafonda Mossesiana, my nurse) and to set up an appointment at the time, as necessary.    Our sleep lab administrative assistant, Alvis LemmingsDawn will meet with you or call you to schedule your sleep study. If you don't hear back from her by next week please feel free to call her at [phone]. This is her direct line and please leave a message with your phone number to call back if you get the voicemail box. She will call back as soon as possible.   We will also be on the look out for leg twitching in your sleep as you also endorse Restless legs symptoms.

## 2016-07-25 ENCOUNTER — Other Ambulatory Visit: Payer: Self-pay | Admitting: Family Medicine

## 2016-07-25 DIAGNOSIS — Z1231 Encounter for screening mammogram for malignant neoplasm of breast: Secondary | ICD-10-CM

## 2016-08-26 ENCOUNTER — Ambulatory Visit (INDEPENDENT_AMBULATORY_CARE_PROVIDER_SITE_OTHER): Payer: BLUE CROSS/BLUE SHIELD | Admitting: Neurology

## 2016-08-26 DIAGNOSIS — G4733 Obstructive sleep apnea (adult) (pediatric): Secondary | ICD-10-CM

## 2016-08-26 DIAGNOSIS — G4720 Circadian rhythm sleep disorder, unspecified type: Secondary | ICD-10-CM

## 2016-08-27 ENCOUNTER — Telehealth: Payer: Self-pay

## 2016-08-27 DIAGNOSIS — G4733 Obstructive sleep apnea (adult) (pediatric): Secondary | ICD-10-CM

## 2016-08-27 NOTE — Telephone Encounter (Signed)
I called pt and advised her of her sleep study results. Pt is agreeable to trying an autoPAP. I advised her that I would send the order to a DME, [facility], and they will be calling her within a week to get it set up. I reviewed cpap compliance expectations with the pt. Pt is agreeable to a follow up on 11/14/16 at 10:30am. Pt verbalized understanding of results. Pt asked that I send a copy of her sleep study results to Dr. Modesto CharonWong because she has an appt with him today. Copy faxed, received a receipt of confirmation. Pt had no questions at this time but was encouraged to call back if questions arise.

## 2016-08-27 NOTE — Progress Notes (Signed)
Patient referred by Dr. Lucia GaskinsAhern, seen by me on 06/17/16, diagnostic PSG on 08/26/16.     Please call and notify the patient that the recent sleep study did confirm the diagnosis of obstructive sleep apnea. OSA is overall mild, but worth treating to see if she feels better after treatment ie with regards to her HAs and her EDS. To that end, I recommend treatment in the form of autoPAP, which means, that we don't have to bring her back for a second sleep study with CPAP, but will let her try an autoPAP machine at home, through a DME company (of her choice, or as per insurance requirement). The DME representative will educate her on how to use the machine, how to put the mask on, fit her with the right mask, etc. I have not yet placed an order in the chart. Please let me know. Send report to PCP and referring MD. Thanks,   Huston FoleySaima Mina Babula, MD, PhD [facility]

## 2016-08-27 NOTE — Procedures (Signed)
PATIENT'S NAME:  Kelly [name], Kelly [name] DOB:      06/10/1961      MR#:    [id number]     DATE OF RECORDING: 08/26/2016 REFERRING M.D.:  Leodis Sias, MD Study Performed:   Baseline Polysomnogram HISTORY: 56 year old woman with a history of type 2 diabetes, hypertension, vitamin D deficiency, recurrent headaches and obesity, who reports snoring, morning headaches and excessive daytime somnolence. Her Epworth sleepiness score is 17 out of 24, fatigue score is 45 out of 63. The patient's weight 189 pounds with a height of 64 (inches), resulting in a BMI of 32. kg/m2. The patient's neck circumference measured 15.5 inches.  CURRENT MEDICATIONS: Amlodipine, Aspirin, Cholecalciferol, Lisinopril, Metformin, Multi-Vitamin   PROCEDURE:  This is a multichannel digital polysomnogram utilizing the Somnostar 11.2 system.  Electrodes and sensors were applied and monitored per AASM Specifications.   EEG, EOG, Chin and Limb EMG, were sampled at 200 Hz.  ECG, Snore and Nasal Pressure, Thermal Airflow, Respiratory Effort, CPAP Flow and Pressure, Oximetry was sampled at 50 Hz. Digital video and audio were recorded.      BASELINE STUDY  Lights Out was at 21:08 and Lights On at 05:03.  Total recording time (TRT) was 476 minutes, with a total sleep time (TST) of  351 minutes.   The patient's sleep latency was 7 minutes.  REM latency was 49 minutes, which is mildly reduced.  The sleep efficiency was 73.7 %, which is reduced.     SLEEP ARCHITECTURE: WASO (Wake after sleep onset) was 115.5 minutes with moderate sleep fragmentation noted.  There were 17.5 minutes in Stage N1, 230 minutes Stage N2, 0 minutes Stage N3 and 103.5 minutes in Stage REM.  The percentage of Stage N1 was 5.%, Stage N2 was 65.5%, which is mildly increased, Stage N3 was absent and Stage R (REM sleep) was 29.5%, which is mildly increased.   The arousals were noted as: 27 were spontaneous, 0 were associated with PLMs, 25 were associated with respiratory  events.  Audio and video analysis did not show any abnormal or unusual movements, behaviors, phonations or vocalizations.  The patient took 1 bathroom break. Mild to moderate (at times) snoring was noted. The EKG was in keeping with normal sinus rhythm (NSR).  RESPIRATORY ANALYSIS:  There were a total of 31 respiratory events:  8 obstructive apneas, 0 central apneas and 2 mixed apneas with a total of 10 apneas and an apnea index (AI) of 1.7 /hour. There were 21 hypopneas with a hypopnea index of 3.6 /hour. The patient also had 0 respiratory event related arousals (RERAs).      The total APNEA/HYPOPNEA INDEX (AHI) was 5.3/hour and the total RESPIRATORY DISTURBANCE INDEX was 5.3 /hour.  16 events occurred in REM sleep and 20 events in NREM. The REM AHI was 9.3 /hour, versus a non-REM AHI of 3.6. The patient spent 74.5 minutes of total sleep time in the supine position and 277 minutes in non-supine.. The supine AHI was 20.1 versus a non-supine AHI of 1.3.  OXYGEN SATURATION & C02:  The Wake baseline 02 saturation was 93%, with the lowest being 88%. Time spent below 89% saturation equaled 0 minutes.  PERIODIC LIMB MOVEMENTS:   The patient had a total of 0 Periodic Limb Movements.  The Periodic Limb Movement (PLM) index was 0 and the PLM Arousal index was 0/hour.    Post-study, the patient indicated that sleep was the same as usual.   IMPRESSION:  1. Obstructive Sleep Apnea (OSA) 2. Dysfunctions  associated with sleep stages or arousal from sleep  RECOMMENDATIONS:  1. This study demonstrates overall mild or borderline obstructive sleep apnea, more pronounced in REM sleep and moderate by number of events in supine sleep, with a total AHI of 5.3/hour, REM AHI of 9.3/hour, supine AHI of 20.1/h, and O2 nadir of 88%. Given the patient's medical history and sleep related complaints, treatment with positive airway pressure in the form of CPAP or autoPAP is reasonable and will be offered to the patient.  Other treatment options may include avoidance of supine sleep position along with weight loss, upper airway or jaw surgery in selected patients or the use of an oral appliance in certain padtients. ENT evaluation and/or consultation with a maxillofacial surgeon or dentist may be feasible and some instances.    2. Please note that untreated obstructive sleep apnea carries additional perioperative morbidity. Patients with significant obstructive sleep apnea should receive perioperative PAP therapy and the surgeons and particularly the anesthesiologist should be informed of the diagnosis and the severity of the sleep disordered breathing. 3. This study shows sleep fragmentation and abnormal sleep stage percentages; these are nonspecific findings and per se do not signify an intrinsic sleep disorder or a cause for the patient's sleep-related symptoms. Causes include (but are not limited to) the first night effect of the sleep study, circadian rhythm disturbances, medication effect or an underlying mood disorder or medical problem.  4. The patient should be cautioned not to drive, work at heights, or operate dangerous or heavy equipment when tired or sleepy. Review and reiteration of good sleep hygiene measures should be pursued with any patient. 5. The patient will be seen in follow[facility] for discussion of the test results and further management strategies. The referring provider will be notified of the test results.  I certify that I have reviewed the entire raw data recording prior to the issuance of this report in accordance with the Standards of Accreditation of the American Academy of Sleep Medicine (AASM)   Huston FoleySaima Natalie Mceuen, MD, PhD Diplomat, American Board of Psychiatry and Neurology (Neurology and Sleep Medicine)

## 2016-08-27 NOTE — Telephone Encounter (Signed)
-----   Message from Huston FoleySaima Athar, MD sent at 08/27/2016  8:43 AM EST ----- Patient referred by Dr. Lucia GaskinsAhern, seen by me on 06/17/16, diagnostic PSG on 08/26/16.     Please call and notify the patient that the recent sleep study did confirm the diagnosis of obstructive sleep apnea. OSA is overall mild, but worth treating to see if she feels better after treatment ie with regards to her HAs and her EDS. To that end, I recommend treatment in the form of autoPAP, which means, that we don't have to bring her back for a second sleep study with CPAP, but will let her try an autoPAP machine at home, through a DME company (of her choice, or as per insurance requirement). The DME representative will educate her on how to use the machine, how to put the mask on, fit her with the right mask, etc. I have not yet placed an order in the chart. Please let me know. Send report to PCP and referring MD. Thanks,   Huston FoleySaima Athar, MD, PhD [facility]

## 2016-08-27 NOTE — Telephone Encounter (Signed)
We will start autoPAP trial. Order placed, pls process.

## 2016-08-28 NOTE — Telephone Encounter (Signed)
Order sent to [facility]. 

## 2016-09-03 ENCOUNTER — Ambulatory Visit
Admission: RE | Admit: 2016-09-03 | Discharge: 2016-09-03 | Disposition: A | Discharge status: Home / Self Care | Payer: BLUE CROSS/BLUE SHIELD | Source: Ambulatory Visit | Attending: Family Medicine | Admitting: Family Medicine

## 2016-09-03 DIAGNOSIS — Z1231 Encounter for screening mammogram for malignant neoplasm of breast: Secondary | ICD-10-CM

## 2016-11-14 ENCOUNTER — Ambulatory Visit (INDEPENDENT_AMBULATORY_CARE_PROVIDER_SITE_OTHER): Payer: BLUE CROSS/BLUE SHIELD | Admitting: Neurology

## 2016-11-14 ENCOUNTER — Encounter: Payer: Self-pay | Admitting: Neurology

## 2016-11-14 ENCOUNTER — Encounter (INDEPENDENT_AMBULATORY_CARE_PROVIDER_SITE_OTHER): Payer: Self-pay

## 2016-11-14 VITALS — BP 136/74 | HR 70 | Resp 16 | Ht 64.5 in | Wt 194.0 lb

## 2016-11-14 DIAGNOSIS — Z9989 Dependence on other enabling machines and devices: Secondary | ICD-10-CM

## 2016-11-14 DIAGNOSIS — G4733 Obstructive sleep apnea (adult) (pediatric): Secondary | ICD-10-CM

## 2016-11-14 NOTE — Patient Instructions (Addendum)
Please continue using your autoPAP regularly. While your insurance requires that you use PAP at least 4 hours each night on 70% of the nights, I recommend, that you not skip any nights and use it throughout the night if you can. Getting used to PAP and staying with the treatment long term does take time and patience and discipline. Untreated obstructive sleep apnea when it is moderate to severe can have an adverse impact on cardiovascular health and raise her risk for heart disease, arrhythmias, hypertension, congestive heart failure, stroke and diabetes. Untreated obstructive sleep apnea causes sleep disruption, nonrestorative sleep, and sleep deprivation. This can have an impact on your day to day functioning and cause daytime sleepiness and impairment of cognitive function, memory loss, mood disturbance, and problems focussing. Using PAP regularly can improve these symptoms.  Please try to avoid taking longer naps.  Please remember to try to maintain good sleep hygiene, which means: Keep a regular sleep and wake schedule, try not to exercise or have a meal within 2 hours of your bedtime, try to keep your bedroom conducive for sleep, that is, cool and dark, without light distractors such as an illuminated alarm clock, and refrain from watching TV right before sleep or in the middle of the night and do not keep the TV or radio on during the night. Also, try not to use or play on electronic devices at bedtime, such as your cell phone, tablet PC or laptop. If you like to read at bedtime on an electronic device, try to dim the background light as much as possible. Do not eat in the middle of the night.   Please try to achieve 7-8 hours of sleep at night.   Keep up the good work! We will see you back in 6 months for sleep apnea check up, and if you continue to do well, yearly thereafter. You can see one of our nurse practitioners as you are stable.

## 2016-11-14 NOTE — Progress Notes (Signed)
Subjective:    Patient ID: Kelly [name] is a 56 y.o. female.  HPI     Interim history:   Kelly [name] is a 56 year old right-handed woman with an underlying medical history of type 2 diabetes, hypertension, vitamin D deficiency, recurrent headaches and obesity, who presents for follow-up consultation of her sleep disorder, after recent sleep study testing. The patient is unaccompanied today. I first met her on 06/17/2016 at the request of Dr. Jaynee Eagles, at which time she reported snoring, morning headaches and daytime somnolence. I invited her for a sleep study. She had a baseline sleep study on 08/26/2016. I went over her test results with her in detail today. Her sleep efficiency was 73.7%, sleep latency 7 minutes, REM latency 49 minutes. She had an increased percentage of stage II sleep, absence of slow-wave sleep and REM sleep was 29.5%. She had a total AHI of 5.3 per hour, REM AHI was 9.3 per hour, supine AHI was 20.1 per hour, average oxygen saturation 93%, nadir was 88%. Based on her test results and sleep related complaints I suggested AutoPap therapy at home.   Today, 11/14/2016: I reviewed her autoPAP compliance data from 10/14/2016 through 11/12/2016 which is a total of 30 days, during which time she used her machine every night with percent used days greater than 4 hours at 93%, indicating excellent compliance with an average usage of 5 hours and 50 minutes, residual AHI 0.6 per hour, 95th percentile pressure at 9.2 cm, leak acceptable with the 95th percentile at 11.6 L/m on a pressure range of 4-13 with EPR. She reports doing better now, some adjustment in the beginning, AM HAs better, less restless overall and with regards to restless legs and leg movements, less sleepy during the day, but takes longer naps, sometimes 2-3 hours, does not sleep that much at night, watches TV in bed. Overall, feels that her BP numbers are better, is pleased with how she is doing.  The patient's allergies, current  medications, family history, past medical history, past social history, past surgical history and problem list were reviewed and updated as appropriate.   Previously (copied from previous notes for reference):   06/17/2016: She reports snoring, morning headaches and excessive daytime somnolence. I reviewed your office note from 06/12/2016. Her Epworth sleepiness score is 17 out of 24 today, her fatigue score is 45 out of 63. She works full-time as a Chemical engineer, but currently on leave. She lives at home with her spouse and son. She is a nonsmoker. She used to drink alcohol occasionally, but none since 1996, she does not drink caffeine on a daily basis. She has RLS type symptoms for months, and nearly nightly, husband has noted restless sleep, leg movements and witnessed breathing pauses. It helps to move her legs in bed, kicks her covers off.  Does not typically have to watch TV at night, but has TV on in Bedroom. No pets in the household. No FHx of OSA.  She has a bedtime between 9:30 and 10 PM, wakeup time generally is between 445 and 5 AM. Some 6 weeks ago she went to [facility] ER because of elevated blood pressure. She was started by her previous PCP on a new blood pressure medication which did not help. She recently switched to another PCP. She does not endorse any night to night nocturia. She has had nighttime palpitations as well. She has had morning headaches for the past 2 months or so.  Her Past Medical History Is Significant For:  Past Medical History:  Diagnosis Date  . Anemia   . Colon polyps   . Diabetes mellitus without complication ([place])   . Hemorrhoids   . Hypertension   . Vitamin D deficiency     Her Past Surgical History Is Significant For: Past Surgical History:  Procedure Laterality Date  . ABDOMINAL HYSTERECTOMY  2006  . BACK SURGERY    . EYE SURGERY  2006   LASIC    Her Family History Is Significant For: Family History  Problem Relation Age of Onset  .  Cancer Mother   . Hypertension Mother   . Diabetes Mother   . Breast cancer Mother   . Hyperlipidemia Father   . Diabetes Father   . Vision loss Father   . Hyperlipidemia Brother   . Hypertension Brother   . Breast cancer Paternal Grandmother     Her Social History Is Significant For: Social History   Social History  . Marital status: Married    Spouse name: N/A  . Number of children: 1  . Years of education: BA   Occupational History  . [employer]     Social History Main Topics  . Smoking status: Never Smoker  . Smokeless tobacco: Never Used  . Alcohol use Yes     Comment: Soc  . Drug use: No  . Sexual activity: Not Asked   Other Topics Concern  . None   Social History Narrative   Rare caffeine use     Her Allergies Are:  Allergies  Allergen Reactions  . Amlodipine Swelling  . Meloxicam Other (See Comments)  . Metoprolol Other (See Comments)    Hypertension   . Tramadol Hcl Other (See Comments)  :   Her Current Medications Are:  Outpatient Encounter Prescriptions as of 11/14/2016  Medication Sig  . amLODipine (NORVASC) 2.5 MG tablet Take 2.5 mg by mouth 2 (two) times daily.   Marland Kitchen aspirin (ASPIR-81) 81 MG EC tablet 1 tablet  . Cholecalciferol (VITAMIN D) 2000 units CAPS Take by mouth.  Marland Kitchen lisinopril-hydrochlorothiazide (PRINZIDE,ZESTORETIC) 20-12.5 MG per tablet Take 1 tablet by mouth daily.  . metFORMIN (GLUCOPHAGE) 500 MG tablet Take 250 mg by mouth 2 (two) times daily with a meal.  . Multiple Vitamin (MULTIVITAMIN) tablet Take 1 tablet by mouth daily.  . Turmeric 500 MG CAPS    No facility-administered encounter medications on file as of 11/14/2016.   :  Review of Systems:  Out of a complete 14 point review of systems, all are reviewed and negative with the exception of these symptoms as listed below:  Review of Systems  Neurological:       Patient states that she is doing ok with CPAP. She asks if she should wear it when she takes a nap.      Objective:  Neurologic Exam  Physical Exam Physical Examination:   Vitals:   11/14/16 1014  BP: 136/74  Pulse: 70  Resp: 16    General Examination: The patient is a very pleasant 56 y.o. female in no acute distress. She appears well-developed and well-nourished and well groomed.   HEENT: Normocephalic, atraumatic, pupils are equal, round and reactive to light and accommodation. Extraocular tracking is good without limitation to gaze excursion or nystagmus noted. Normal smooth pursuit is noted. Hearing is grossly intact. Face is symmetric with normal facial animation and normal facial sensation. Speech is clear with no dysarthria noted. There is no hypophonia. There is no lip, neck/head, jaw or voice tremor. Neck is  supple with full range of passive and active motion. There are no carotid bruits on auscultation. Oropharynx exam reveals: mild mouth dryness, good dental hygiene and mild airway crowding. Tongue protrudes centrally and palate elevates symmetrically. Tonsils are 1+ in size.   Chest: Clear to auscultation without wheezing, rhonchi or crackles noted.  Heart: S1+S2+0, regular and normal without murmurs, rubs or gallops noted.   Abdomen: Soft, non-tender and non-distended with normal bowel sounds appreciated on auscultation.  Extremities: There is puffiness in both ankles and distal lower extremities bilaterally.   Skin: Warm and dry without trophic changes noted.  Musculoskeletal: exam reveals no obvious joint deformities, tenderness or joint swelling or erythema.   Neurologically:  Mental status: The patient is awake, alert and oriented in all 4 spheres. Her immediate and remote memory, attention, language skills and fund of knowledge are appropriate. There is no evidence of aphasia, agnosia, apraxia or anomia. Speech is clear with normal prosody and enunciation. Thought process is linear. Mood is normal and affect is normal.  Cranial nerves II - XII are as  described above under HEENT exam.  Motor exam: Normal bulk, strength and tone is noted. There is no drift, tremor or rebound. Romberg is negative. Reflexes are 1-2+ throughout. Fine motor skills and coordination: grossly intact. There is no truncal or gait ataxia.  Sensory exam: intact to light touch in the upper and lower extremities.  Gait, station and balance: She stands easily. No veering to one side is noted. No leaning to one side is noted. Posture is age-appropriate and stance is narrow based. Gait shows normal stride length and normal pace. Tandem walk is challenging for her.       Assessment and Plan:   In summary, Ileane Sando is a very pleasant 56 year old female with an underlying medical history of type 2 diabetes, hypertension, vitamin D deficiency, recurrent headaches and obesity, whoPresents for follow-up consultation of her sleep disorder, after her sleep study and trial of AutoPap therapy. She had a baseline sleep study on 08/26/2016 which showed mild to moderate obstructive sleep apnea. She had no significant PLMS, increase in light stage sleep, absence of slow-wave sleep. She was encouraged to try AutoPap therapy. She is compliant with treatment. She feels improved in her daytime symptoms, morning headaches and restless leg symptoms. She is encouraged to make more time for sleep at night, try to sleep 7-8 hours if possible, reduce her daytime naps at least in length. She is encouraged to keep a set schedule for her sleep and wake times. Furthermore, she is encouraged to work on weight loss. For now, I suggested she continue with AutoPap therapy. She is commended for her treatment adherence, we reviewed her sleep study results as well as her compliance data together. Physical exam is stable. I suggested a 6 month follow-up, she can see one of our nurse checked her sugars. I answered all her questions today and she was in agreement.  I spent 25 minutes in total face-to-face time with the  patient, more than 50% of which was spent in counseling and coordination of care, reviewing test results, reviewing medication and discussing or reviewing the diagnosis of OSA, its prognosis and treatment options. Pertinent laboratory and imaging test results that were available during this visit with the patient were reviewed by me and considered in my medical decision making (see chart for details).

## 2017-05-09 DIAGNOSIS — Z0289 Encounter for other administrative examinations: Secondary | ICD-10-CM

## 2017-05-18 ENCOUNTER — Encounter: Payer: Self-pay | Admitting: Nurse Practitioner

## 2017-05-18 NOTE — Progress Notes (Addendum)
[facility]  PATIENT: Kelly [name] DOB: 1960/08/11   REASON FOR VISIT: Follow-up for obstructive sleep CPAP compliance HISTORY FROM:patient    HISTORY OF PRESENT ILLNESS:Ms. Kelly [name] is a 56 year old right-handed woman with an underlying medical history of type 2 diabetes, hypertension, vitamin D deficiency, recurrent headaches and obesity, who presents for follow-up consultation of her sleep disorder, after recent sleep study testing. The patient is unaccompanied today. I first met her on 06/17/2016 at the request of Dr. Jaynee Eagles, at which time she reported snoring, morning headaches and daytime somnolence. I invited her for a sleep study. She had a baseline sleep study on 08/26/2016. I went over her test results with her in detail today. Her sleep efficiency was 73.7%, sleep latency 7 minutes, REM latency 49 minutes. She had an increased percentage of stage II sleep, absence of slow-wave sleep and REM sleep was 29.5%. She had a total AHI of 5.3 per hour, REM AHI was 9.3 per hour, supine AHI was 20.1 per hour, average oxygen saturation 93%, nadir was 88%. Based on her test results and sleep related complaints I suggested AutoPap therapy at home.   Today, 11/14/2016: I reviewed her autoPAP compliance data from 10/14/2016 through 11/12/2016 which is a total of 30 days, during which time she used her machine every night with percent used days greater than 4 hours at 93%, indicating excellent compliance with an average usage of 5 hours and 50 minutes, residual AHI 0.6 per hour, 95th percentile pressure at 9.2 cm, leak acceptable with the 95th percentile at 11.6 L/m on a pressure range of 4-13 with EPR. She reports doing better now, some adjustment in the beginning, AM HAs better, less restless overall and with regards to restless legs and leg movements, less sleepy during the day, but takes longer naps, sometimes 2-3 hours, does not sleep that much at night, watches TV in bed. Overall, feels  that her BP numbers are better, is pleased with how she is doing. UPDATE 11/19/2018CM [name], 56 year old female returns for follow-up with history of obstructive sleep apnea with CPAP usage.  She is here for compliance data.  Data dated 04/19/2017 through 05/18/2017 shows 93% compliance for 28 days.  Average usage 6 hours 50 minutes.  Pressure 4-13 cm.  EPR 3 AHI 1.1.  She returns for reevaluation.  She does report that her close friend  just died after being in hospice.  Overall with her CPAP she feels better.  Blood pressure in the office today mildly elevated.  Recent adjustments to her Norvasc   REVIEW OF SYSTEMS: Full 14 system review of systems performed and notable only for those listed, all others are neg:  Constitutional: neg  Cardiovascular: neg Ear/Nose/Throat: neg  Skin: neg Eyes: neg Respiratory: neg Gastroitestinal: neg  Hematology/Lymphatic: neg  Endocrine: neg Musculoskeletal:neg Allergy/Immunology: neg Neurological: neg Psychiatric: neg Sleep : Obstructive sleep apnea with CPAP   ALLERGIES: Allergies  Allergen Reactions  . Amlodipine Swelling  . Meloxicam Other (See Comments)  . Metoprolol Other (See Comments)    Hypertension   . Tramadol Hcl Other (See Comments)    HOME MEDICATIONS: Outpatient Medications Prior to Visit  Medication Sig Dispense Refill  . amLODipine (NORVASC) 2.5 MG tablet Take 5 mg at bedtime by mouth.   2  . aspirin (ASPIR-81) 81 MG EC tablet 1 tablet    . Cholecalciferol (VITAMIN D) 2000 units CAPS Take by mouth.    Marland Kitchen lisinopril (PRINIVIL,ZESTRIL) 20 MG tablet     . metFORMIN (  GLUCOPHAGE) 500 MG tablet Take 250 mg daily by mouth.     . Multiple Vitamin (MULTIVITAMIN) tablet Take 1 tablet by mouth daily.    . Turmeric 500 MG CAPS 2,000 mg.     . amLODipine (NORVASC) 5 MG tablet Take 5 mg at bedtime by mouth.  1  . aspirin 81 MG chewable tablet Chew by mouth.    . calcium-vitamin D 250-100 MG-UNIT tablet Take by mouth.    Marland Kitchen  HYDROcodone-acetaminophen (NORCO) 7.5-325 MG tablet Take by mouth.    Marland Kitchen lisinopril-hydrochlorothiazide (PRINZIDE,ZESTORETIC) 20-12.5 MG per tablet Take 1 tablet by mouth daily.    . metFORMIN (GLUCOPHAGE-XR) 500 MG 24 hr tablet     . methocarbamol (ROBAXIN) 750 MG tablet Take by mouth.     No facility-administered medications prior to visit.     PAST MEDICAL HISTORY: Past Medical History:  Diagnosis Date  . Anemia   . Colon polyps   . Diabetes mellitus without complication ([place])   . Hemorrhoids   . Hypertension   . Vitamin D deficiency     PAST SURGICAL HISTORY: Past Surgical History:  Procedure Laterality Date  . ABDOMINAL HYSTERECTOMY  2006  . BACK SURGERY    . EYE SURGERY  2006   LASIC    FAMILY HISTORY: Family History  Problem Relation Age of Onset  . Cancer Mother   . Hypertension Mother   . Diabetes Mother   . Breast cancer Mother   . Hyperlipidemia Father   . Diabetes Father   . Vision loss Father   . Hyperlipidemia Brother   . Hypertension Brother   . Breast cancer Paternal Grandmother     SOCIAL HISTORY: Social History   Socioeconomic History  . Marital status: Married    Spouse name: Not on file  . Number of children: 1  . Years of education: BA  . Highest education level: Not on file  Social Needs  . Financial resource strain: Not on file  . Food insecurity - worry: Not on file  . Food insecurity - inability: Not on file  . Transportation needs - medical: Not on file  . Transportation needs - non-medical: Not on file  Occupational History  . Occupation: [employer]   Tobacco Use  . Smoking status: Never Smoker  . Smokeless tobacco: Never Used  Substance and Sexual Activity  . Alcohol use: No    Frequency: Never  . Drug use: No  . Sexual activity: Not on file  Other Topics Concern  . Not on file  Social History Narrative   Rare caffeine use      PHYSICAL EXAM  Vitals:   05/19/17 1341  BP: (!) 157/90  Pulse: 79  Weight: 203 lb 3.2  oz (92.2 kg)   Body mass index is 34.34 kg/m.  Generalized: Well developed, obese female in no acute distress  Head: normocephalic and atraumatic,. Oropharynx benign  Neck: Supple,  Musculoskeletal: No deformity   Neurological examination   Mentation: Alert oriented to time, place, history taking. Attention span and concentration appropriate. Recent and remote memory intact.  Follows all commands speech and language fluent. ESS 8.  Cranial nerve II-XII: Pupils were equal round reactive to light extraocular movements were full, visual field were full on confrontational test. Facial sensation and strength were normal. hearing was intact to finger rubbing bilaterally. Uvula tongue midline. head turning and shoulder shrug were normal and symmetric.Tongue protrusion into cheek strength was normal. Motor: normal bulk and tone, full strength in the  BUE, BLE, fine finger movements normal, no pronator drift. No focal weakness Sensory: normal and symmetric to light touch,  Coordination: finger-nose-finger, heel-to-shin bilaterally, no dysmetria Reflexes: Brachioradialis 2/2, biceps 2/2, triceps 2/2, patellar 2/2, Achilles 2/2, plantar responses were flexor bilaterally. Gait and Station: Rising up from seated position without assistance, normal stance,  moderate stride, good arm swing, smooth turning, able to perform tiptoe, and heel walking without difficulty. Tandem gait is steady  DIAGNOSTIC DATA (LABS, IMAGING, TESTING) - I reviewed patient records, labs, notes, testing and imaging myself where available.  Lab Results  Component Value Date   HGB 14.3 05/13/2008   HCT 42.0 05/13/2008    ASSESSMENT AND PLAN  56 year old female with an underlying medical history of type 2 diabetes, hypertension, vitamin D deficiency, recurrent headaches and obesity, who presents for follow-up consultation of her sleep disorder, after her sleep study and trial of AutoPap therapy. She had a baseline sleep study  on 08/26/2016 which showed mild to moderate obstructive sleep apnea. She had no significant PLMS, increase in light stage sleep, absence of slow-wave sleep. She is compliant with treatment. She feels improved in her daytime symptoms, morning headaches and restless leg symptoms.  Data dated 04/19/2017 through 05/18/2017 shows 93% compliance for 28 days.  Average usage 6 hours 50 minutes.  Pressure 4-13 cm.  EPR 3 AHI 1.1.    93% CPAP compliance Continue same settings Follow-up yearly and as needed Dennie Bible, [facility], [facility], APRN  [facility] [address], [place], [place] [address] [phone]  I reviewed the above note and documentation by the Nurse Practitioner and agree with the history, physical exam, assessment and plan as outlined above. I was immediately available for face-to-face consultation. Star Age, MD, PhD [facility])

## 2017-05-19 ENCOUNTER — Encounter (INDEPENDENT_AMBULATORY_CARE_PROVIDER_SITE_OTHER): Payer: Self-pay

## 2017-05-19 ENCOUNTER — Ambulatory Visit: Payer: BLUE CROSS/BLUE SHIELD | Admitting: Nurse Practitioner

## 2017-05-19 ENCOUNTER — Encounter: Payer: Self-pay | Admitting: Nurse Practitioner

## 2017-05-19 ENCOUNTER — Ambulatory Visit: Payer: BLUE CROSS/BLUE SHIELD | Admitting: Neurology

## 2017-05-19 DIAGNOSIS — Z9989 Dependence on other enabling machines and devices: Secondary | ICD-10-CM

## 2017-05-19 DIAGNOSIS — G4733 Obstructive sleep apnea (adult) (pediatric): Secondary | ICD-10-CM | POA: Diagnosis not present

## 2017-05-19 NOTE — Patient Instructions (Signed)
93% CPAP compliance Continue same settings Follow-up yearly and as needed

## 2017-08-12 ENCOUNTER — Other Ambulatory Visit: Payer: Self-pay | Admitting: Family Medicine

## 2017-08-12 DIAGNOSIS — Z1231 Encounter for screening mammogram for malignant neoplasm of breast: Secondary | ICD-10-CM

## 2017-09-08 ENCOUNTER — Ambulatory Visit
Admission: RE | Admit: 2017-09-08 | Discharge: 2017-09-08 | Disposition: A | Discharge status: Home / Self Care | Payer: BLUE CROSS/BLUE SHIELD | Source: Ambulatory Visit | Attending: Family Medicine | Admitting: Family Medicine

## 2017-09-08 DIAGNOSIS — Z1231 Encounter for screening mammogram for malignant neoplasm of breast: Secondary | ICD-10-CM

## 2017-09-11 ENCOUNTER — Telehealth: Payer: Self-pay | Admitting: Neurology

## 2017-09-11 DIAGNOSIS — Z9989 Dependence on other enabling machines and devices: Secondary | ICD-10-CM

## 2017-09-11 DIAGNOSIS — G4733 Obstructive sleep apnea (adult) (pediatric): Secondary | ICD-10-CM

## 2017-09-11 NOTE — Telephone Encounter (Signed)
Pt called and stated she needs a new prescription sent to [facility] so she can get new supplies.

## 2017-09-11 NOTE — Addendum Note (Signed)
Addended by: Geronimo RunningINKINS, Ardene Remley A on: 09/11/2017 11:55 AM   Modules accepted: Orders

## 2017-09-11 NOTE — Telephone Encounter (Signed)
Order for new supplies placed, sent to [facility].

## 2017-10-15 DIAGNOSIS — Z0271 Encounter for disability determination: Secondary | ICD-10-CM

## 2018-01-07 ENCOUNTER — Telehealth: Payer: Self-pay

## 2018-01-07 NOTE — Telephone Encounter (Signed)
Received a form for pt. Pt is requesting that Dr. 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   Dr. Frances FurbishAthar reviewed the form and says that she cannot comment on if her PTSD exacerbates pt's osa and FSAD. Dr. Frances FurbishAthar recommends [facility], who may be able to better comment on the connection.  I called pt, advised her of this information. She will come pick up this form and will be reimbursed for the fee.

## 2018-05-19 ENCOUNTER — Ambulatory Visit: Payer: BLUE CROSS/BLUE SHIELD | Admitting: Nurse Practitioner

## 2021-02-18 ENCOUNTER — Other Ambulatory Visit: Payer: Self-pay

## 2021-02-18 ENCOUNTER — Emergency Department (INDEPENDENT_AMBULATORY_CARE_PROVIDER_SITE_OTHER)
Admission: EM | Admit: 2021-02-18 | Discharge: 2021-02-18 | Disposition: A | Discharge status: Home / Self Care | Payer: No Typology Code available for payment source | Source: Home / Self Care | Attending: Family Medicine | Admitting: Family Medicine

## 2021-02-18 ENCOUNTER — Encounter: Payer: Self-pay | Admitting: Emergency Medicine

## 2021-02-18 DIAGNOSIS — U071 COVID-19: Secondary | ICD-10-CM | POA: Diagnosis not present

## 2021-02-18 NOTE — Discharge Instructions (Addendum)
Rest and drink plenty of fluids Take over-the-counter cough and cold medicines as needed A few have any questions or problems, call your primary care doctor or call for a video visit for COVID advice

## 2021-02-18 NOTE — ED Provider Notes (Signed)
[name]    CSN: [id number] Arrival date & time: 02/18/21  1330      History   Chief Complaint Chief Complaint  Patient presents with   URI    HPI Kelly [name] is a 60 y.o. female.   HPI Patient been sick for almost a week.  She has sore throat, fatigue, some night sweats.  She has had some body aches and coughing.  She tested herself at home today.  She was positive for COVID.  She is here for advice on how to manage COVID.  I explained to her that she is too late to take one of the COVID medications because of the duration of her illness.  At this point she just needs over-the-counter medications for her symptoms, and rest at home.  I recommend an additional 5 days of quarantine since her COVID test was positive today  Past Medical History:  Diagnosis Date   Anemia    Colon polyps    Diabetes mellitus without complication (HCC)    Hemorrhoids    Hypertension    Vitamin D deficiency     Patient Active Problem List   Diagnosis Date Noted   Obstructive sleep apnea treated with continuous positive airway pressure (CPAP) 05/19/2017   Headache 06/12/2016    Past Surgical History:  Procedure Laterality Date   ABDOMINAL HYSTERECTOMY  2006   BACK SURGERY     EYE SURGERY  2006   LASIC    OB History   No obstetric history on file.      Home Medications    Prior to Admission medications   Medication Sig Start Date End Date Taking? Authorizing Provider  amLODipine (NORVASC) 2.5 MG tablet Take 5 mg at bedtime by mouth.  05/16/16  Yes [provider]  aspirin 81 MG EC tablet 1 tablet   Yes [provider]  Cholecalciferol (VITAMIN D) 2000 units CAPS Take by mouth.   Yes [provider]  lisinopril (PRINIVIL,ZESTRIL) 20 MG tablet  05/03/17  Yes [provider]  metFORMIN (GLUCOPHAGE) 500 MG tablet Take 250 mg daily by mouth.    Yes [provider]  Multiple Vitamin (MULTIVITAMIN) tablet Take 1 tablet by mouth  daily.   Yes [provider]  Turmeric 500 MG CAPS 2,000 mg.    Yes [provider]    Family History Family History  Problem Relation Age of Onset   Cancer Mother    Hypertension Mother    Diabetes Mother    Breast cancer Mother    Hyperlipidemia Father    Diabetes Father    Vision loss Father    Hyperlipidemia Brother    Hypertension Brother    Breast cancer Paternal Grandmother     Social History Social History   Tobacco Use   Smoking status: Never   Smokeless tobacco: Never  Substance Use Topics   Alcohol use: No   Drug use: No     Allergies   Amlodipine, Meloxicam, Metoprolol, and Tramadol hcl   Review of Systems Review of Systems  See HPI Physical Exam Triage Vital Signs ED Triage Vitals  Enc Vitals Group     BP 02/18/21 1409 139/87     Pulse Rate 02/18/21 1409 86     Resp 02/18/21 1409 18     Temp 02/18/21 1409 98.8 F (37.1 C)     Temp Source 02/18/21 1409 Oral     SpO2 02/18/21 1409 97 %     Weight 02/18/21  1411 205 lb (93 kg)     Height 02/18/21 1411 5\' 4"  (1.626 m)     Head Circumference --      Peak Flow --      Pain Score 02/18/21 1411 6     Pain Loc --      Pain Edu? --      Excl. in GC? --    No data found.  Updated Vital Signs BP 139/87 (BP Location: Right Arm)   Pulse 86   Temp 98.8 F (37.1 C) (Oral)   Resp 18   Ht 5\' 4"  (1.626 m)   Wt 93 kg   SpO2 97%   BMI 35.19 kg/m      Physical Exam Constitutional:      General: She is not in acute distress.    Appearance: She is well-developed. She is obese.     Comments: Appears tired  HENT:     Head: Normocephalic and atraumatic.     Mouth/Throat:     Comments: Mask is in place Eyes:     Conjunctiva/sclera: Conjunctivae normal.     Pupils: Pupils are equal, round, and reactive to light.  Cardiovascular:     Rate and Rhythm: Normal rate and regular rhythm.     Heart sounds: Normal heart sounds.  Pulmonary:     Effort: Pulmonary effort is normal. No  respiratory distress.     Breath sounds: Normal breath sounds. No wheezing or rales.  Abdominal:     General: There is no distension.     Palpations: Abdomen is soft.  Musculoskeletal:        General: Normal range of motion.     Cervical back: Normal range of motion.  Skin:    General: Skin is warm and dry.  Neurological:     Mental Status: She is alert.  Psychiatric:        Mood and Affect: Mood normal.        Behavior: Behavior normal.     UC Treatments / Results  Labs (all labs ordered are listed, but only abnormal results are displayed) Labs Reviewed - No data to display  EKG   Radiology No results found.  Procedures Procedures (including critical care time)  Medications Ordered in UC Medications - No data to display  Initial Impression / Assessment and Plan / UC Course  I have reviewed the triage vital signs and the nursing notes.  Pertinent labs & imaging results that were available during my care of the patient were reviewed by me and considered in my medical decision making (see chart for details).      Final Clinical Impressions(s) / UC Diagnoses   Final diagnoses:  COVID-19     Discharge Instructions      Rest and drink plenty of fluids Take over-the-counter cough and cold medicines as needed A few have any questions or problems, call your primary care doctor or call for a video visit for COVID advice     ED Prescriptions   None    PDMP not reviewed this encounter.   02/20/21, MD 02/18/21 [phone]

## 2021-02-18 NOTE — ED Triage Notes (Signed)
Patient c/o sore throat, runny nose, congestion, no ear pain x 1 week.  Patient has been taken Mucinex, ginger tea, Nyquil.  Home COVID test this morning was positive.  Patient is vaccinated for COVID.

## 2023-01-30 ENCOUNTER — Ambulatory Visit
Admission: EM | Admit: 2023-01-30 | Discharge: 2023-01-30 | Disposition: A | Discharge status: Home / Self Care | Payer: No Typology Code available for payment source | Attending: Family Medicine | Admitting: Family Medicine

## 2023-01-30 ENCOUNTER — Other Ambulatory Visit: Payer: Self-pay

## 2023-01-30 DIAGNOSIS — R059 Cough, unspecified: Secondary | ICD-10-CM

## 2023-01-30 DIAGNOSIS — U071 COVID-19: Secondary | ICD-10-CM | POA: Diagnosis not present

## 2023-01-30 MED ORDER — PROMETHAZINE-DM 6.25-15 MG/5ML PO SYRP: 5.0000 mL | ORAL_SOLUTION | Freq: Two times a day (BID) | ORAL | 0 refills | Status: AC | PRN

## 2023-01-30 MED ORDER — BENZONATATE 200 MG PO CAPS: 200.0000 mg | ORAL_CAPSULE | Freq: Three times a day (TID) | ORAL | 0 refills | Status: AC | PRN

## 2023-01-30 MED ORDER — PAXLOVID (300/100) 20 X 150 MG & 10 X 100MG PO TBPK: 3.0000 | ORAL_TABLET | Freq: Two times a day (BID) | ORAL | 0 refills | Status: AC

## 2023-01-30 NOTE — Discharge Instructions (Addendum)
Advised patient to take medication as directed with food to completion.  Advised patient may take Tessalon Perles daily or as needed for cough.  Advised may use Promethazine DM at night for cough prior to sleep due to sedative effects.  Encouraged increase daily water intake to 64 ounces per day while taking these medications.  Advised if symptoms worsen and/or unresolved please follow-up PCP or here for further evaluation.

## 2023-01-30 NOTE — ED Provider Notes (Signed)
[name]    CSN: [id number] Arrival date & time: 01/30/23  1016      History   Chief Complaint No chief complaint on file.   HPI Kelly [name] is a 62 y.o. female.   HPI Very pleasant 62 year old female presents with positive home COVID-19 test and request Paxlovid.  PMH significant for obesity, T2 DM without complication, and HTN.  Patient is accompanied by her husband this morning.  Past Medical History:  Diagnosis Date   Anemia    Colon polyps    Diabetes mellitus without complication (HCC)    Hemorrhoids    Hypertension    Vitamin D deficiency     Patient Active Problem List   Diagnosis Date Noted   Obstructive sleep apnea treated with continuous positive airway pressure (CPAP) 05/19/2017   Headache 06/12/2016    Past Surgical History:  Procedure Laterality Date   ABDOMINAL HYSTERECTOMY  2006   BACK SURGERY     EYE SURGERY  2006   LASIC    OB History   No obstetric history on file.      Home Medications    Prior to Admission medications   Medication Sig Start Date End Date Taking? Authorizing Provider  benzonatate (TESSALON) 200 MG capsule Take 1 capsule (200 mg total) by mouth 3 (three) times daily as needed for up to 7 days. 01/30/23 02/06/23 Yes Trevor Iha, FNP  nirmatrelvir & ritonavir (PAXLOVID, 300/100,) 20 x 150 MG & 10 x 100MG  TBPK Take 3 tablets by mouth 2 (two) times daily for 5 days. 01/30/23 02/04/23 Yes Trevor Iha, FNP  promethazine-dextromethorphan (PROMETHAZINE-DM) 6.25-15 MG/5ML syrup Take 5 mLs by mouth 2 (two) times daily as needed for cough. 01/30/23  Yes Trevor Iha, FNP  amLODipine (NORVASC) 2.5 MG tablet Take 5 mg at bedtime by mouth.  05/16/16   [provider]  aspirin 81 MG EC tablet 1 tablet    [provider]  Cholecalciferol (VITAMIN D) 2000 units CAPS Take by mouth.    [provider]  lisinopril (PRINIVIL,ZESTRIL) 20 MG tablet  05/03/17   [provider]  metFORMIN  (GLUCOPHAGE) 500 MG tablet Take 250 mg daily by mouth.     [provider]  Multiple Vitamin (MULTIVITAMIN) tablet Take 1 tablet by mouth daily.    [provider]  Turmeric 500 MG CAPS 2,000 mg.     [provider]    Family History Family History  Problem Relation Age of Onset   Cancer Mother    Hypertension Mother    Diabetes Mother    Breast cancer Mother    Hyperlipidemia Father    Diabetes Father    Vision loss Father    Hyperlipidemia Brother    Hypertension Brother    Breast cancer Paternal Grandmother     Social History Social History   Tobacco Use   Smoking status: Never   Smokeless tobacco: Never  Substance Use Topics   Alcohol use: No   Drug use: No     Allergies   Amlodipine, Meloxicam, Metoprolol, and Tramadol hcl   Review of Systems Review of Systems  Constitutional:  Positive for chills.  HENT:  Positive for congestion.   Respiratory:  Positive for cough.   Musculoskeletal:  Positive for arthralgias and myalgias.  All other systems reviewed and are negative.    Physical Exam Triage Vital Signs ED Triage Vitals [01/30/23 1031]  Encounter Vitals Group     BP 136/85  Systolic BP Percentile      Diastolic BP Percentile      Pulse Rate 86     Resp 16     Temp 99.5 F (37.5 C)     Temp Source Oral     SpO2      Weight      Height      Head Circumference      Peak Flow      Pain Score 6     Pain Loc      Pain Education      Exclude from Growth Chart    No data found.  Updated Vital Signs BP 136/85 (BP Location: Right Arm)   Pulse 86   Temp 99.5 F (37.5 C) (Oral)   Resp 16   Physical Exam Vitals and nursing note reviewed.  Constitutional:      Appearance: Normal appearance. She is obese. She is ill-appearing.  HENT:     Head: Normocephalic and atraumatic.     Right Ear: Tympanic membrane, ear canal and external ear normal.     Left Ear: Tympanic membrane, ear canal and external ear normal.      Mouth/Throat:     Mouth: Mucous membranes are moist.     Pharynx: Oropharynx is clear.  Eyes:     Extraocular Movements: Extraocular movements intact.     Conjunctiva/sclera: Conjunctivae normal.     Pupils: Pupils are equal, round, and reactive to light.  Cardiovascular:     Rate and Rhythm: Normal rate and regular rhythm.     Pulses: Normal pulses.     Heart sounds: Normal heart sounds.  Pulmonary:     Effort: Pulmonary effort is normal.     Breath sounds: Normal breath sounds. No wheezing, rhonchi or rales.     Comments: Infrequent nonproductive cough noted on exam Musculoskeletal:        General: Normal range of motion.     Cervical back: Normal range of motion and neck supple.  Skin:    General: Skin is warm and dry.  Neurological:     General: No focal deficit present.     Mental Status: She is alert and oriented to person, place, and time. Mental status is at baseline.  Psychiatric:        Mood and Affect: Mood normal.        Behavior: Behavior normal.        Thought Content: Thought content normal.      UC Treatments / Results  Labs (all labs ordered are listed, but only abnormal results are displayed) Labs Reviewed - No data to display  EKG   Radiology No results found.  Procedures Procedures (including critical care time)  Medications Ordered in UC Medications - No data to display  Initial Impression / Assessment and Plan / UC Course  I have reviewed the triage vital signs and the nursing notes.  Pertinent labs & imaging results that were available during my care of the patient were reviewed by me and considered in my medical decision making (see chart for details).     MDM: 1.  COVID-19-Rx'd Paxlovid (300/100) 20 x 150 mg & 10 x 1000mg  TBPK; 2.  Cough, unspecified type, Rx'd Promethazine DM 6.25-15 mg / 5 mL syrup: Take 5 mL by mouth twice daily as needed for cough, Rx'd Tessalon Perles 200 mg 3 times daily as needed for cough. Advised patient to take  medication as directed with food to completion.  Advised patient  may take Tessalon Perles daily or as needed for cough.  Advised may use Promethazine DM at night for cough prior to sleep due to sedative effects.  Encouraged increase daily water intake to 64 ounces per day while taking these medications.  Advised if symptoms worsen and/or unresolved please follow-up PCP or here for further evaluation.  Patient discharged home, hemodynamically stable.  Final Clinical Impressions(s) / UC Diagnoses   Final diagnoses:  COVID-19  Cough, unspecified type     Discharge Instructions      Advised patient to take medication as directed with food to completion.  Advised patient may take Tessalon Perles daily or as needed for cough.  Advised may use Promethazine DM at night for cough prior to sleep due to sedative effects.  Encouraged increase daily water intake to 64 ounces per day while taking these medications.  Advised if symptoms worsen and/or unresolved please follow-up PCP or here for further evaluation.     ED Prescriptions     Medication Sig Dispense Auth. Provider   nirmatrelvir & ritonavir (PAXLOVID, 300/100,) 20 x 150 MG & 10 x 100MG  TBPK Take 3 tablets by mouth 2 (two) times daily for 5 days. 30 tablet Trevor Iha, FNP   benzonatate (TESSALON) 200 MG capsule Take 1 capsule (200 mg total) by mouth 3 (three) times daily as needed for up to 7 days. 40 capsule Trevor Iha, FNP   promethazine-dextromethorphan (PROMETHAZINE-DM) 6.25-15 MG/5ML syrup Take 5 mLs by mouth 2 (two) times daily as needed for cough. 118 mL Trevor Iha, FNP      PDMP not reviewed this encounter.   Trevor Iha, FNP 01/30/23 1127

## 2023-01-30 NOTE — ED Triage Notes (Signed)
Had positive covid test at home, wants paxlovid
